# Patient Record
Sex: Female | Born: 1955 | Race: White | Hispanic: No | Smoking: Current every day smoker
Health system: Southern US, Community
[De-identification: ages and names within clinical notes are randomized; demographics above are authoritative.]

## PROBLEM LIST (undated history)

## (undated) DIAGNOSIS — M542 Cervicalgia: Secondary | ICD-10-CM

## (undated) DIAGNOSIS — F32A Depression, unspecified: Secondary | ICD-10-CM

## (undated) DIAGNOSIS — I10 Essential (primary) hypertension: Secondary | ICD-10-CM

## (undated) DIAGNOSIS — F17200 Nicotine dependence, unspecified, uncomplicated: Secondary | ICD-10-CM

## (undated) DIAGNOSIS — K219 Gastro-esophageal reflux disease without esophagitis: Secondary | ICD-10-CM

## (undated) DIAGNOSIS — L219 Seborrheic dermatitis, unspecified: Secondary | ICD-10-CM

## (undated) DIAGNOSIS — M5090 Cervical disc disorder, unspecified, unspecified cervical region: Secondary | ICD-10-CM

## (undated) DIAGNOSIS — M545 Other chronic pain: Secondary | ICD-10-CM

## (undated) DIAGNOSIS — R079 Chest pain, unspecified: Secondary | ICD-10-CM

## (undated) DIAGNOSIS — G8929 Other chronic pain: Secondary | ICD-10-CM

## (undated) DIAGNOSIS — M25519 Pain in unspecified shoulder: Secondary | ICD-10-CM

## (undated) DIAGNOSIS — M1990 Unspecified osteoarthritis, unspecified site: Secondary | ICD-10-CM

## (undated) DIAGNOSIS — F329 Major depressive disorder, single episode, unspecified: Secondary | ICD-10-CM

## (undated) DIAGNOSIS — M25549 Pain in joints of unspecified hand: Secondary | ICD-10-CM

## (undated) DIAGNOSIS — R7303 Prediabetes: Secondary | ICD-10-CM

## (undated) DIAGNOSIS — E785 Hyperlipidemia, unspecified: Secondary | ICD-10-CM

## (undated) HISTORY — DX: Depression, unspecified: F32.A

## (undated) HISTORY — DX: Chest pain, unspecified: R07.9

## (undated) HISTORY — DX: Gastro-esophageal reflux disease without esophagitis: K21.9

## (undated) HISTORY — DX: Prediabetes: R73.03

## (undated) HISTORY — DX: Pain in unspecified shoulder: M25.519

## (undated) HISTORY — DX: Cervical disc disorder, unspecified, unspecified cervical region: M50.90

## (undated) HISTORY — DX: Unspecified osteoarthritis, unspecified site: M19.90

## (undated) HISTORY — DX: Other chronic pain: G89.29

## (undated) HISTORY — DX: Seborrheic dermatitis, unspecified: L21.9

## (undated) HISTORY — DX: Pain in joints of unspecified hand: M25.549

## (undated) HISTORY — DX: Other chronic pain: M54.50

## (undated) HISTORY — DX: Hyperlipidemia, unspecified: E78.5

## (undated) HISTORY — DX: Major depressive disorder, single episode, unspecified: F32.9

## (undated) HISTORY — PX: TUBAL LIGATION: SHX77

## (undated) HISTORY — DX: Nicotine dependence, unspecified, uncomplicated: F17.200

## (undated) HISTORY — PX: CHOLECYSTECTOMY: SHX55

## (undated) HISTORY — DX: Cervicalgia: M54.2

## (undated) HISTORY — DX: Essential (primary) hypertension: I10

---

## 1998-11-08 ENCOUNTER — Emergency Department (HOSPITAL_COMMUNITY): Admission: EM | Admit: 1998-11-08 | Discharge: 1998-11-08 | Payer: Self-pay | Admitting: Emergency Medicine

## 2001-01-24 ENCOUNTER — Other Ambulatory Visit: Admission: RE | Admit: 2001-01-24 | Discharge: 2001-01-24 | Payer: Self-pay | Admitting: Obstetrics and Gynecology

## 2001-02-04 ENCOUNTER — Emergency Department (HOSPITAL_COMMUNITY): Admission: EM | Admit: 2001-02-04 | Discharge: 2001-02-04 | Payer: Self-pay | Admitting: *Deleted

## 2001-02-04 ENCOUNTER — Encounter: Payer: Self-pay | Admitting: *Deleted

## 2009-06-17 ENCOUNTER — Other Ambulatory Visit: Admission: RE | Admit: 2009-06-17 | Discharge: 2009-06-17 | Payer: Self-pay | Admitting: Orthopaedic Surgery

## 2012-09-19 ENCOUNTER — Other Ambulatory Visit (HOSPITAL_COMMUNITY): Payer: Self-pay | Admitting: Internal Medicine

## 2012-09-19 DIAGNOSIS — Z139 Encounter for screening, unspecified: Secondary | ICD-10-CM

## 2012-09-26 ENCOUNTER — Ambulatory Visit (HOSPITAL_COMMUNITY)
Admission: RE | Admit: 2012-09-26 | Discharge: 2012-09-26 | Disposition: A | Discharge status: Home / Self Care | Payer: BC Managed Care – PPO | Source: Ambulatory Visit | Attending: Internal Medicine | Admitting: Internal Medicine

## 2012-09-26 DIAGNOSIS — Z139 Encounter for screening, unspecified: Secondary | ICD-10-CM

## 2012-09-26 DIAGNOSIS — Z1231 Encounter for screening mammogram for malignant neoplasm of breast: Secondary | ICD-10-CM | POA: Insufficient documentation

## 2012-09-29 ENCOUNTER — Encounter: Payer: Self-pay | Admitting: *Deleted

## 2012-10-17 ENCOUNTER — Other Ambulatory Visit (HOSPITAL_COMMUNITY)
Admission: RE | Admit: 2012-10-17 | Discharge: 2012-10-17 | Disposition: A | Discharge status: Home / Self Care | Payer: BC Managed Care – PPO | Source: Ambulatory Visit | Attending: Obstetrics & Gynecology | Admitting: Obstetrics & Gynecology

## 2012-10-17 ENCOUNTER — Ambulatory Visit (INDEPENDENT_AMBULATORY_CARE_PROVIDER_SITE_OTHER): Payer: BC Managed Care – HMO | Admitting: Obstetrics & Gynecology

## 2012-10-17 ENCOUNTER — Encounter: Payer: Self-pay | Admitting: Obstetrics & Gynecology

## 2012-10-17 ENCOUNTER — Telehealth: Payer: Self-pay

## 2012-10-17 VITALS — BP 150/84 | Ht 63.0 in | Wt 178.8 lb

## 2012-10-17 DIAGNOSIS — Z1151 Encounter for screening for human papillomavirus (HPV): Secondary | ICD-10-CM | POA: Insufficient documentation

## 2012-10-17 DIAGNOSIS — Z1212 Encounter for screening for malignant neoplasm of rectum: Secondary | ICD-10-CM

## 2012-10-17 DIAGNOSIS — Z01419 Encounter for gynecological examination (general) (routine) without abnormal findings: Secondary | ICD-10-CM

## 2012-10-17 LAB — HEMOCCULT GUIAC POC 1CARD (OFFICE): Fecal Occult Blood, POC: NEGATIVE

## 2012-10-17 MED ORDER — OSPEMIFENE 60 MG PO TABS: 1.0000 | ORAL_TABLET | Freq: Every day | ORAL | Status: DC

## 2012-10-17 NOTE — Telephone Encounter (Signed)
Pt was referred by Dr. Dwana Melena for screening colonoscopy. Letter was mailed on 09/19/2012. I tried to call just now and number is not a working number. Will mail another letter and send letter to PCP.

## 2012-10-17 NOTE — Progress Notes (Signed)
Patient ID: Brooke Webb, female   DOB: [birth year], 57 y.o.   MRN: [id number] Subjective:     Brooke Webb is a 57 y.o. female here for a routine exam.  No LMP recorded. Patient is postmenopausal. No obstetric history on file. Current complaints: none.  Personal health questionnaire reviewed: no.   Gynecologic History No LMP recorded. Patient is postmenopausal. Contraception: post menopausal status Last Pap: 20 years ago. Results were: normal Last mammogram: 2014. Results were: normal  Obstetric History OB History  No data available     The following portions of the patient's history were reviewed and updated as appropriate: allergies, current medications, past family history, past medical history, past social history, past surgical history and problem list.  Review of Systems  Review of Systems  Constitutional: Negative for fever, chills, weight loss, malaise/fatigue and diaphoresis.  HENT: Negative for hearing loss, ear pain, nosebleeds, congestion, sore throat, neck pain, tinnitus and ear discharge.   Eyes: Negative for blurred vision, double vision, photophobia, pain, discharge and redness.  Respiratory: Negative for cough, hemoptysis, sputum production, shortness of breath, wheezing and stridor.   Cardiovascular: Negative for chest pain, palpitations, orthopnea, claudication, leg swelling and PND.  Gastrointestinal: negative for abdominal pain. Negative for heartburn, nausea, vomiting, diarrhea, constipation, blood in stool and melena.  Genitourinary: Negative for dysuria, urgency, frequency, hematuria and flank pain.  Musculoskeletal: Negative for myalgias, back pain, joint pain and falls.  Skin: Negative for itching and rash.  Neurological: Negative for dizziness, tingling, tremors, sensory change, speech change, focal weakness, seizures, loss of consciousness, weakness and headaches.  Endo/Heme/Allergies: Negative for environmental allergies and polydipsia. Does not  bruise/bleed easily.  Psychiatric/Behavioral: Negative for depression, suicidal ideas, hallucinations, memory loss and substance abuse. The patient is not nervous/anxious and does not have insomnia.        Objective:    Physical Exam  Vitals reviewed. Constitutional: She is oriented to person, place, and time. She appears well-developed and well-nourished.  HENT:  Head: Normocephalic and atraumatic.        Right Ear: External ear normal.  Left Ear: External ear normal.  Nose: Nose normal.  Mouth/Throat: Oropharynx is clear and moist.  Eyes: Conjunctivae and EOM are normal. Pupils are equal, round, and reactive to light. Right eye exhibits no discharge. Left eye exhibits no discharge. No scleral icterus.  Neck: Normal range of motion. Neck supple. No tracheal deviation present. No thyromegaly present.  Cardiovascular: Normal rate, regular rhythm, normal heart sounds and intact distal pulses.  Exam reveals no gallop and no friction rub.   No murmur heard. Respiratory: Effort normal and breath sounds normal. No respiratory distress. She has no wheezes. She has no rales. She exhibits no tenderness.  GI: Soft. Bowel sounds are normal. She exhibits no distension and no mass. There is no tenderness. There is no rebound and no guarding.  Genitourinary:  Breasts no masses or skin changes      Vulva is normal without lesions Vagina is pink moist without discharge Cervix normal in appearance and pap is done Uterus is normal size shape and contour Adnexa is negative with normal sized ovaries  Rectal heme negative no masses  Musculoskeletal: Normal range of motion. She exhibits no edema and no tenderness.  Neurological: She is alert and oriented to person, place, and time. She has normal reflexes. She displays normal reflexes. No cranial nerve deficit. She exhibits normal muscle tone. Coordination normal.  Skin: Skin is warm and dry. No rash noted. No erythema. No  pallor.  Psychiatric: She has a  normal mood and affect. Her behavior is normal. Judgment and thought content normal.       Assessment:    Healthy female exam.    Plan:    Follow up in: 1 year. osphena 60 mg daily

## 2013-09-21 IMAGING — MG MM DIGITAL SCREENING
4 series · 4 of 4 positions shown · non-contrast
Comparison: Previous exam(s).

CLINICAL DATA: Screening.

DIGITAL SCREENING BILATERAL MAMMOGRAM WITH CAD

[L CC]
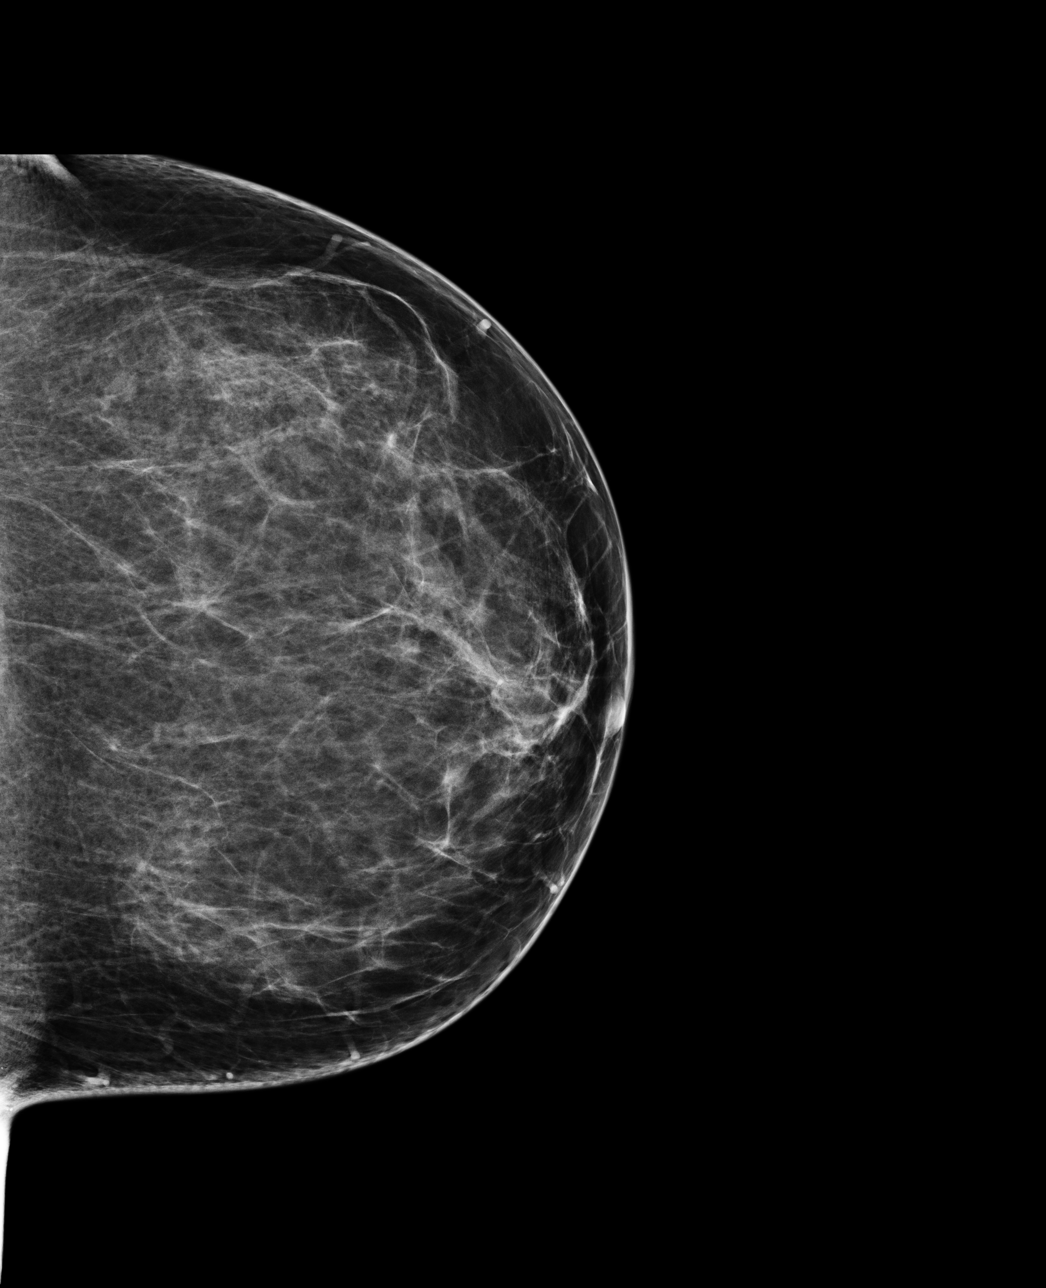

[L MLO]
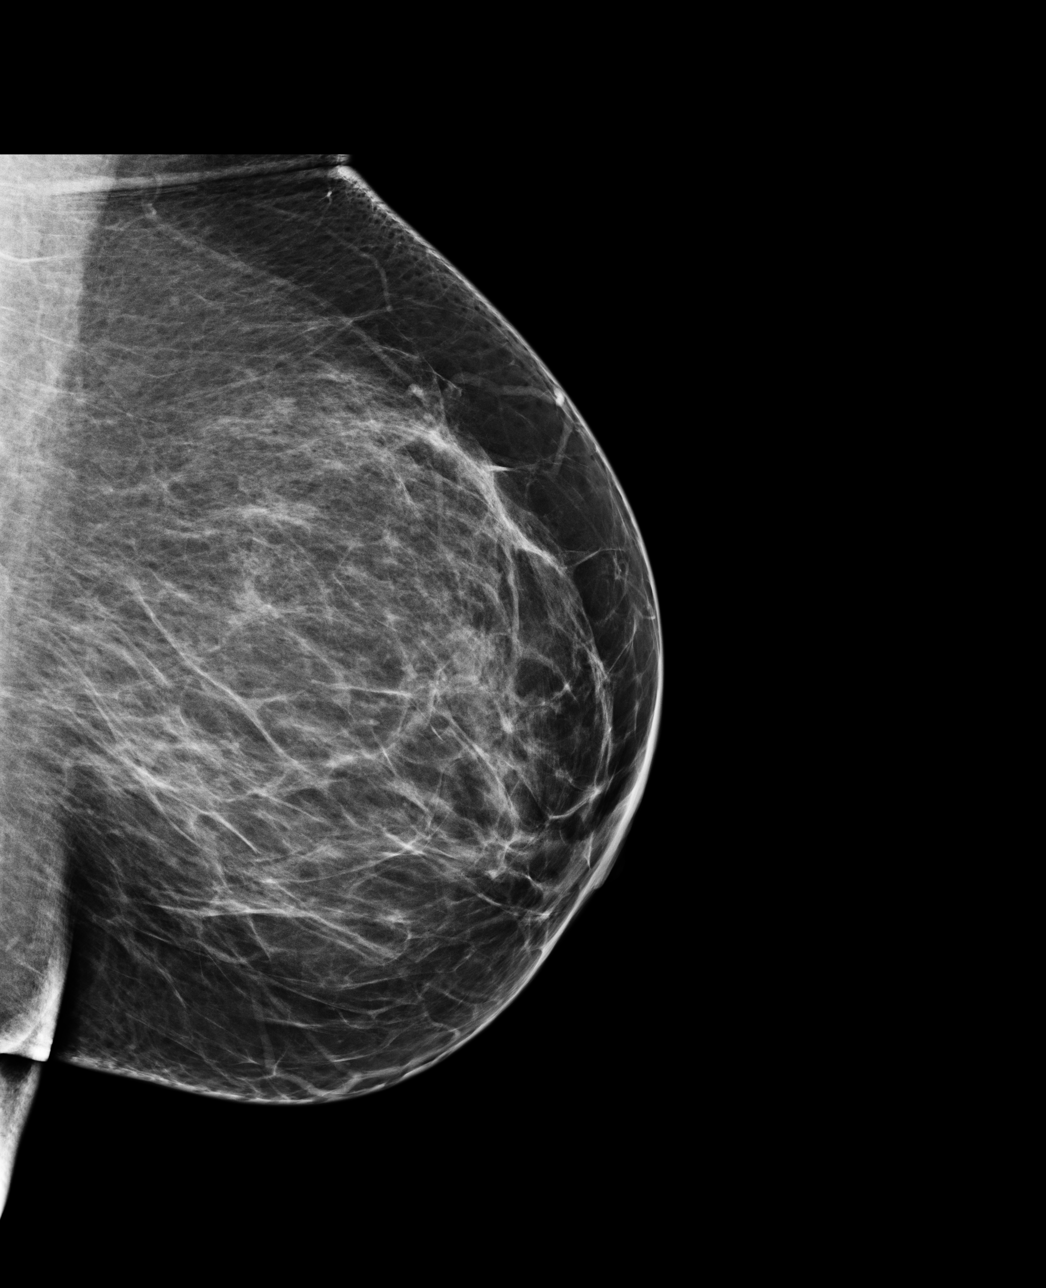

[R CC]
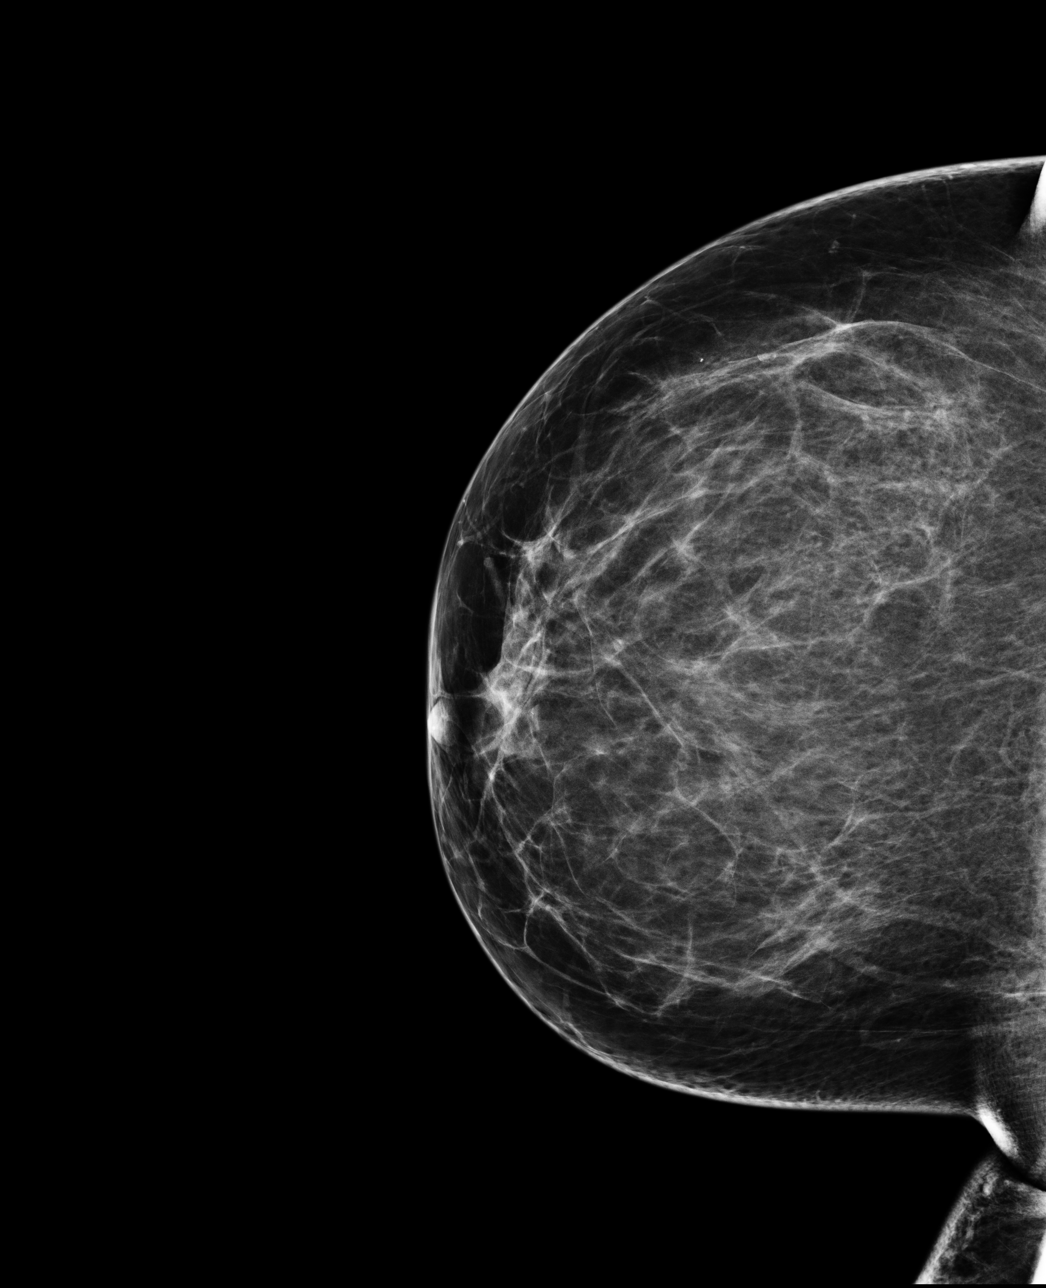

[R MLO]
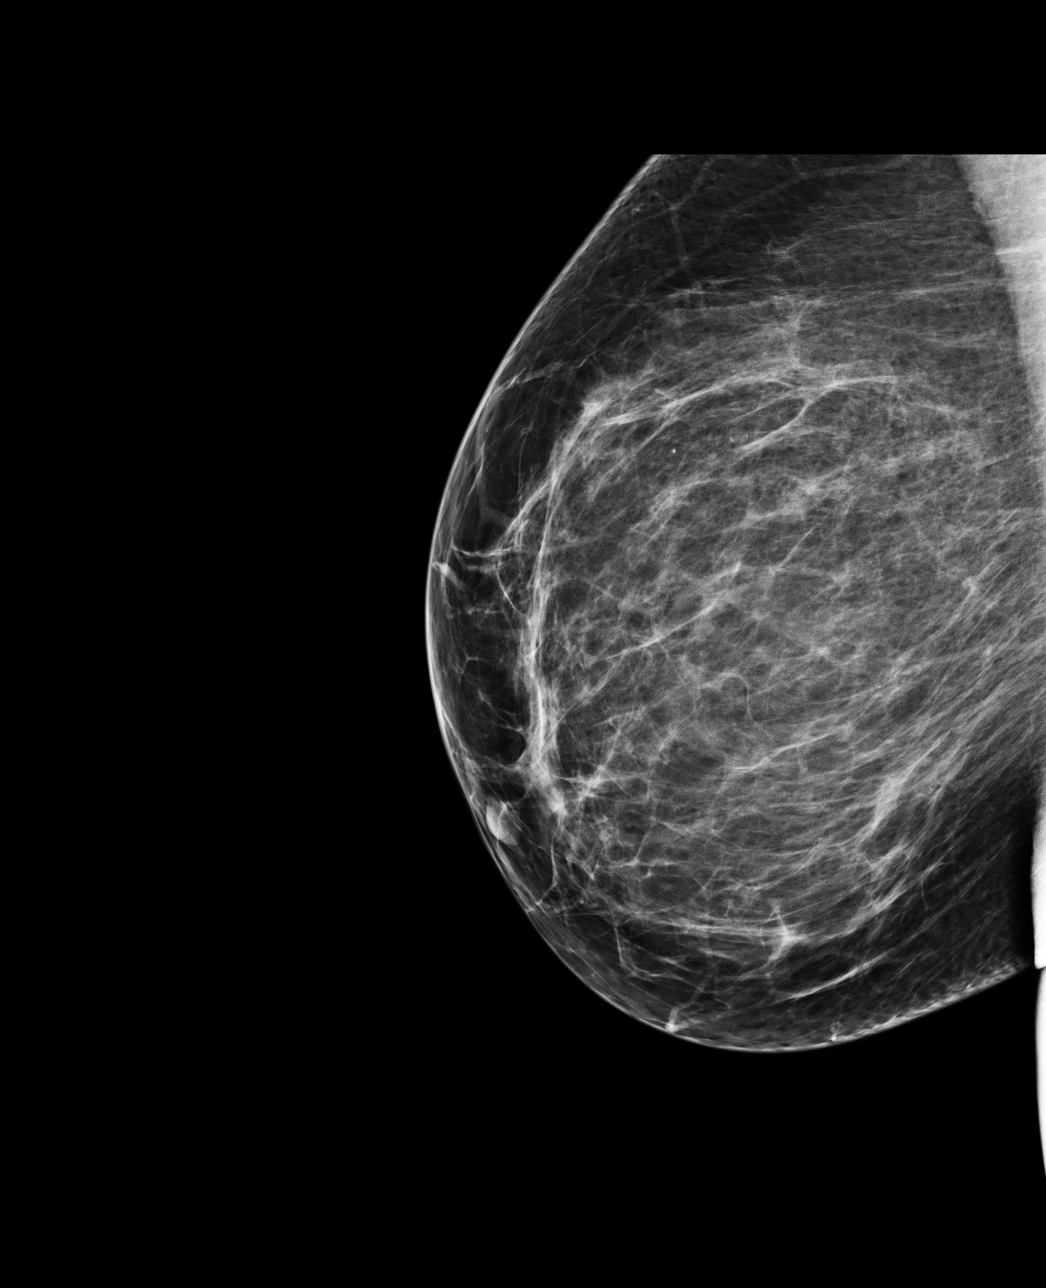

[4 of 4 positions shown; findings below may reference images not displayed]

FINDINGS: ACR Breast Density Category b:  There are scattered areas of
fibroglandular density.

There are no findings suspicious for malignancy.

Images were processed with CAD.
IMPRESSION: No mammographic evidence of malignancy.

A result letter of this screening mammogram will be mailed directly
to the patient.

RECOMMENDATION:
Screening mammogram in one year. (Code:RE-T-HFS)

BI-RADS CATEGORY 1:  Negative.

## 2015-07-28 ENCOUNTER — Telehealth: Payer: Self-pay | Admitting: Orthopaedic Surgery

## 2015-07-29 MED ORDER — NAPROXEN 500 MG PO TABS: 500.0000 mg | ORAL_TABLET | Freq: Two times a day (BID) | ORAL | Status: DC

## 2015-07-29 NOTE — Telephone Encounter (Signed)
Rx Done . 

## 2015-09-04 ENCOUNTER — Telehealth: Payer: Self-pay | Admitting: Orthopaedic Surgery

## 2015-10-24 ENCOUNTER — Telehealth: Payer: Self-pay | Admitting: Orthopaedic Surgery

## 2016-04-22 ENCOUNTER — Ambulatory Visit (INDEPENDENT_AMBULATORY_CARE_PROVIDER_SITE_OTHER): Payer: BLUE CROSS/BLUE SHIELD

## 2016-04-22 ENCOUNTER — Encounter: Payer: Self-pay | Admitting: Orthopaedic Surgery

## 2016-04-22 ENCOUNTER — Ambulatory Visit (INDEPENDENT_AMBULATORY_CARE_PROVIDER_SITE_OTHER): Payer: BLUE CROSS/BLUE SHIELD | Admitting: Orthopaedic Surgery

## 2016-04-22 VITALS — BP 163/88 | HR 70 | Ht 62.0 in | Wt 152.0 lb

## 2016-04-22 DIAGNOSIS — M25511 Pain in right shoulder: Secondary | ICD-10-CM

## 2016-04-22 DIAGNOSIS — G8929 Other chronic pain: Secondary | ICD-10-CM

## 2016-04-22 DIAGNOSIS — M542 Cervicalgia: Secondary | ICD-10-CM

## 2016-04-22 DIAGNOSIS — F17210 Nicotine dependence, cigarettes, uncomplicated: Secondary | ICD-10-CM

## 2016-04-22 MED ORDER — PREDNISONE 5 MG (21) PO TBPK
ORAL_TABLET | ORAL | 0 refills | Status: DC
Start: 2016-04-22 — End: 2021-07-14

## 2016-04-22 NOTE — Progress Notes (Signed)
Patient WU:JWJXBJ:Brooke Webb, female DOB:Jul 10, 1955, 61 y.o. [id number]  Chief Complaint  Patient presents with  . Shoulder Pain    RIGHT SHOULDER PAIN    HPI  Brooke Webb is a 61 y.o. female who is seen by me for the first time since December 2015.  She has neck pain with paresthesias down the right arm to the right hand, more of the index finger.  She has no weakness or trauma.  She has also recurrent pain of the right shoulder at times.  She is better today but has pain with overhead use.  She has no redness or swelling.  She also smokes and is not willing to stop.  HPI  Body mass index is 27.8 kg/m.  ROS  Review of Systems  HENT: Negative for congestion.   Respiratory: Negative for cough and shortness of breath.   Cardiovascular: Negative for chest pain and leg swelling.  Endocrine: Negative for cold intolerance.  Musculoskeletal: Positive for arthralgias and neck pain.  Allergic/Immunologic: Negative for environmental allergies.    Past Medical History:  Diagnosis Date  . Arthralgia of hand   . Arthralgia of shoulder   . Depression   . Hypertension     Past Surgical History:  Procedure Laterality Date  . CHOLECYSTECTOMY    . TUBAL LIGATION      Family History  Problem Relation Age of Onset  . Heart disease Mother   . Diabetes Mother     Social History Social History  Substance Use Topics  . Smoking status: Current Every Day Smoker    Packs/day: 0.50    Types: Cigarettes  . Smokeless tobacco: Never Used  . Alcohol use No    No Known Allergies  Current Outpatient Prescriptions  Medication Sig Dispense Refill  . aspirin 325 MG tablet Take 325 mg by mouth daily.    Marland Kitchen. ibuprofen (ADVIL,MOTRIN) 600 MG tablet Take 600 mg by mouth every 6 (six) hours as needed for pain.    Marland Kitchen. losartan-hydrochlorothiazide (HYZAAR) 50-12.5 MG per tablet Take 1 tablet by mouth daily.    . naproxen (NAPROSYN) 500 MG tablet Take 1 tablet (500 mg total) by mouth 2 (two)  times daily with a meal. 60 tablet 5  . naproxen (NAPROSYN) 500 MG tablet TAKE ONE TABLET BY MOUTH TWICE DAILY AFTER A MEAL 60 tablet 5  . OMEPRAZOLE PO Take 10 mg by mouth daily.     . Ospemifene (OSPHENA) 60 MG TABS Take 1 tablet by mouth daily. 30 tablet 11  . predniSONE (STERAPRED UNI-PAK 21 TAB) 5 MG (21) TBPK tablet Take 6 pills first day; 5 pills second day; 4 pills third day; 3 pills fourth day; 2 pills next day and 1 pill last day. 21 tablet 0  . venlafaxine (EFFEXOR) 75 MG tablet Take 75 mg by mouth daily.     No current facility-administered medications for this visit.      Physical Exam  Blood pressure (!) 163/88, pulse 70, height 5\' 2"  (1.575 m), weight 152 lb (68.9 kg).  Constitutional: overall normal hygiene, normal nutrition, well developed, normal grooming, normal body habitus. Assistive device:none  Musculoskeletal: gait and station Limp none, muscle tone and strength are normal, no tremors or atrophy is present.  .  Neurological: coordination overall normal.  Deep tendon reflex/nerve stretch intact.  Sensation normal.  Cranial nerves II-XII intact.   Skin:   Normal overall no scars, lesions, ulcers or rashes. No psoriasis.  Psychiatric: Alert and oriented x 3.  Recent memory  intact, remote memory unclear.  Normal mood and affect. Well groomed.  Good eye contact.  Cardiovascular: overall no swelling, no varicosities, no edema bilaterally, normal temperatures of the legs and arms, no clubbing, cyanosis and good capillary refill.  Lymphatic: palpation is normal.  Her neck has full motion but is more tender to the right with motion.  NV is intact.  She has no spasm.  Examination of right Upper Extremity is done.  Inspection:   Overall:  Elbow non-tender without crepitus or defects, forearm non-tender without crepitus or defects, wrist non-tender without crepitus or defects, hand non-tender.    Shoulder: with glenohumeral joint tenderness, without effusion.   Upper  arm: without swelling and tenderness   Range of motion:   Overall:  Full range of motion of the elbow, full range of motion of wrist and full range of motion in fingers.   Shoulder:  right  180 degrees forward flexion; 180 degrees abduction; 40 degrees internal rotation, 40 degrees external rotation, 20 degrees extension, 40 degrees adduction.   Stability:   Overall:  Shoulder, elbow and wrist stable   Strength and Tone:   Overall full shoulder muscles strength, full upper arm strength and normal upper arm bulk and tone.  X-rays were done of the right shoulder and her neck, reported separately.  The patient has been educated about the nature of the problem(s) and counseled on treatment options.  The patient appeared to understand what I have discussed and is in agreement with it.  Encounter Diagnoses  Name Primary?  . Neck pain Yes  . Chronic right shoulder pain   . Cigarette nicotine dependence without complication     PLAN Call if any problems.  Precautions discussed.  Continue current medications except stop the Naprosyn while she is on the prednisone dose pack I will give.  She may need MRI of the neck.  Return to clinic 2 weeks   Electronically Signed Darreld Mclean, MD 3/15/201811:39 AM

## 2016-05-06 ENCOUNTER — Ambulatory Visit (INDEPENDENT_AMBULATORY_CARE_PROVIDER_SITE_OTHER): Payer: BLUE CROSS/BLUE SHIELD | Admitting: Orthopaedic Surgery

## 2016-05-06 ENCOUNTER — Encounter: Payer: Self-pay | Admitting: Orthopaedic Surgery

## 2016-05-06 VITALS — BP 135/93 | HR 77 | Temp 97.9°F | Ht 62.0 in | Wt 152.0 lb

## 2016-05-06 DIAGNOSIS — M542 Cervicalgia: Secondary | ICD-10-CM

## 2016-05-06 DIAGNOSIS — M25511 Pain in right shoulder: Secondary | ICD-10-CM | POA: Diagnosis not present

## 2016-05-06 DIAGNOSIS — G8929 Other chronic pain: Secondary | ICD-10-CM | POA: Diagnosis not present

## 2016-05-06 NOTE — Progress Notes (Signed)
Patient ZO:XWRUEA:Brooke Webb, female DOB:16-Jan-1956, 61 y.o. [id number]  Chief Complaint  Patient presents with  . Follow-up    neck pain    HPI  Brooke Webb is a 61 y.o. female who has had right shoulder pain and neck pain.  She is much improved after the prednisone and medicine.  She has no paresthesias. She is moving well.  She has no redness and she is doing her exercises. HPI  Body mass index is 27.8 kg/m.  ROS  Review of Systems  HENT: Negative for congestion.   Respiratory: Negative for cough and shortness of breath.   Cardiovascular: Negative for chest pain and leg swelling.  Endocrine: Negative for cold intolerance.  Musculoskeletal: Positive for arthralgias and neck pain.  Allergic/Immunologic: Negative for environmental allergies.    Past Medical History:  Diagnosis Date  . Arthralgia of hand   . Arthralgia of shoulder   . Depression   . Hypertension     Past Surgical History:  Procedure Laterality Date  . CHOLECYSTECTOMY    . TUBAL LIGATION      Family History  Problem Relation Age of Onset  . Heart disease Mother   . Diabetes Mother     Social History Social History  Substance Use Topics  . Smoking status: Current Every Day Smoker    Packs/day: 0.50    Types: Cigarettes  . Smokeless tobacco: Never Used  . Alcohol use No    No Known Allergies  Current Outpatient Prescriptions  Medication Sig Dispense Refill  . aspirin 325 MG tablet Take 325 mg by mouth daily.    Marland Kitchen. ibuprofen (ADVIL,MOTRIN) 600 MG tablet Take 600 mg by mouth every 6 (six) hours as needed for pain.    Marland Kitchen. losartan-hydrochlorothiazide (HYZAAR) 50-12.5 MG per tablet Take 1 tablet by mouth daily.    . naproxen (NAPROSYN) 500 MG tablet Take 1 tablet (500 mg total) by mouth 2 (two) times daily with a meal. 60 tablet 5  . naproxen (NAPROSYN) 500 MG tablet TAKE ONE TABLET BY MOUTH TWICE DAILY AFTER A MEAL 60 tablet 5  . OMEPRAZOLE PO Take 10 mg by mouth daily.     . Ospemifene  (OSPHENA) 60 MG TABS Take 1 tablet by mouth daily. 30 tablet 11  . predniSONE (STERAPRED UNI-PAK 21 TAB) 5 MG (21) TBPK tablet Take 6 pills first day; 5 pills second day; 4 pills third day; 3 pills fourth day; 2 pills next day and 1 pill last day. 21 tablet 0  . venlafaxine (EFFEXOR) 75 MG tablet Take 75 mg by mouth daily.     No current facility-administered medications for this visit.      Physical Exam  Blood pressure (!) 135/93, pulse 77, temperature 97.9 F (36.6 C), height 5\' 2"  (1.575 m), weight 152 lb (68.9 kg).  Constitutional: overall normal hygiene, normal nutrition, well developed, normal grooming, normal body habitus. Assistive device:none  Musculoskeletal: gait and station Limp none, muscle tone and strength are normal, no tremors or atrophy is present.  .  Neurological: coordination overall normal.  Deep tendon reflex/nerve stretch intact.  Sensation normal.  Cranial nerves II-XII intact.   Skin:   Normal overall no scars, lesions, ulcers or rashes. No psoriasis.  Psychiatric: Alert and oriented x 3.  Recent memory intact, remote memory unclear.  Normal mood and affect. Well groomed.  Good eye contact.  Cardiovascular: overall no swelling, no varicosities, no edema bilaterally, normal temperatures of the legs and arms, no clubbing, cyanosis and good capillary  refill.  Lymphatic: palpation is normal.  Her neck has full ROM as does the right shoulder. NV is intact.  Grips are normal.  She has no spasm or swelling.  The patient has been educated about the nature of the problem(s) and counseled on treatment options.  The patient appeared to understand what I have discussed and is in agreement with it.  Encounter Diagnoses  Name Primary?  . Neck pain Yes  . Chronic right shoulder pain     PLAN Call if any problems.  Precautions discussed.  Continue current medications.   Return to clinic 1 month   Electronically Signed Darreld Mclean, MD 3/29/201810:54 AM

## 2016-05-06 NOTE — Patient Instructions (Signed)
Steps to Quit Smoking Smoking tobacco can be bad for your health. It can also affect almost every organ in your body. Smoking puts you and people around you at risk for many serious long-lasting (chronic) diseases. Quitting smoking is hard, but it is one of the best things that you can do for your health. It is never too late to quit. What are the benefits of quitting smoking? When you quit smoking, you lower your risk for getting serious diseases and conditions. They can include:  Lung cancer or lung disease.  Heart disease.  Stroke.  Heart attack.  Not being able to have children (infertility).  Weak bones (osteoporosis) and broken bones (fractures). If you have coughing, wheezing, and shortness of breath, those symptoms may get better when you quit. You may also get sick less often. If you are pregnant, quitting smoking can help to lower your chances of having a baby of low birth weight. What can I do to help me quit smoking? Talk with your doctor about what can help you quit smoking. Some things you can do (strategies) include:  Quitting smoking totally, instead of slowly cutting back how much you smoke over a period of time.  Going to in-person counseling. You are more likely to quit if you go to many counseling sessions.  Using resources and support systems, such as:  Online chats with a counselor.  Phone quitlines.  Printed self-help materials.  Support groups or group counseling.  Text messaging programs.  Mobile phone apps or applications.  Taking medicines. Some of these medicines may have nicotine in them. If you are pregnant or breastfeeding, do not take any medicines to quit smoking unless your doctor says it is okay. Talk with your doctor about counseling or other things that can help you. Talk with your doctor about using more than one strategy at the same time, such as taking medicines while you are also going to in-person counseling. This can help make quitting  easier. What things can I do to make it easier to quit? Quitting smoking might feel very hard at first, but there is a lot that you can do to make it easier. Take these steps:  Talk to your family and friends. Ask them to support and encourage you.  Call phone quitlines, reach out to support groups, or work with a counselor.  Ask people who smoke to not smoke around you.  Avoid places that make you want (trigger) to smoke, such as:  Bars.  Parties.  Smoke-break areas at work.  Spend time with people who do not smoke.  Lower the stress in your life. Stress can make you want to smoke. Try these things to help your stress:  Getting regular exercise.  Deep-breathing exercises.  Yoga.  Meditating.  Doing a body scan. To do this, close your eyes, focus on one area of your body at a time from head to toe, and notice which parts of your body are tense. Try to relax the muscles in those areas.  Download or buy apps on your mobile phone or tablet that can help you stick to your quit plan. There are many free apps, such as QuitGuide from the CDC (Centers for Disease Control and Prevention). You can find more support from smokefree.gov and other websites. This information is not intended to replace advice given to you by your health care provider. Make sure you discuss any questions you have with your health care provider. Document Released: 11/21/2008 Document Revised: 09/23/2015 Document   Reviewed: 06/11/2014 Elsevier Interactive Patient Education  2017 [employer]  

## 2016-06-03 ENCOUNTER — Ambulatory Visit: Payer: BLUE CROSS/BLUE SHIELD | Admitting: Orthopaedic Surgery

## 2016-07-28 ENCOUNTER — Telehealth: Payer: Self-pay | Admitting: *Deleted

## 2016-07-28 NOTE — Telephone Encounter (Signed)
Patient called yesterday and cancelled her appts for tody due to the weather. Patient requested morning appt. Called and left message informing  the patient that Dr. Andrey Farmerossi only sees new patients in the morning. Offer July 2nd or July 16th.

## 2016-07-28 NOTE — Telephone Encounter (Signed)
Patient called back and made appt for July 18th.

## 2016-07-31 ENCOUNTER — Telehealth: Payer: Self-pay | Admitting: Orthopaedic Surgery

## 2016-08-25 ENCOUNTER — Other Ambulatory Visit: Payer: BLUE CROSS/BLUE SHIELD

## 2016-08-25 ENCOUNTER — Ambulatory Visit: Payer: BLUE CROSS/BLUE SHIELD | Admitting: Gynecologic Oncology

## 2016-08-27 ENCOUNTER — Other Ambulatory Visit (HOSPITAL_COMMUNITY): Payer: Self-pay | Admitting: Internal Medicine

## 2016-08-27 DIAGNOSIS — Z1231 Encounter for screening mammogram for malignant neoplasm of breast: Secondary | ICD-10-CM

## 2016-09-03 ENCOUNTER — Inpatient Hospital Stay (HOSPITAL_COMMUNITY): Admission: RE | Admit: 2016-09-03 | Payer: BLUE CROSS/BLUE SHIELD | Source: Ambulatory Visit

## 2016-09-15 ENCOUNTER — Ambulatory Visit (HOSPITAL_COMMUNITY)
Admission: RE | Admit: 2016-09-15 | Discharge: 2016-09-15 | Disposition: A | Discharge status: Home / Self Care | Payer: BLUE CROSS/BLUE SHIELD | Source: Ambulatory Visit | Attending: Internal Medicine | Admitting: Internal Medicine

## 2016-09-15 ENCOUNTER — Ambulatory Visit (HOSPITAL_COMMUNITY): Payer: BLUE CROSS/BLUE SHIELD

## 2016-09-15 DIAGNOSIS — Z1231 Encounter for screening mammogram for malignant neoplasm of breast: Secondary | ICD-10-CM | POA: Insufficient documentation

## 2017-06-01 ENCOUNTER — Other Ambulatory Visit: Payer: Self-pay | Admitting: Orthopaedic Surgery

## 2017-06-02 NOTE — Telephone Encounter (Signed)
Refill request received via fax for Naproxen last visit with Dr Hilda LiasKeeling was 07/2016.

## 2017-12-25 ENCOUNTER — Other Ambulatory Visit: Payer: Self-pay | Admitting: Orthopedic Surgery

## 2018-10-01 ENCOUNTER — Other Ambulatory Visit: Payer: Self-pay | Admitting: Orthopedic Surgery

## 2018-11-22 ENCOUNTER — Other Ambulatory Visit: Payer: Self-pay | Admitting: Orthopedic Surgery

## 2019-01-05 ENCOUNTER — Other Ambulatory Visit: Payer: Self-pay | Admitting: Orthopedic Surgery

## 2019-01-08 ENCOUNTER — Other Ambulatory Visit: Payer: Self-pay | Admitting: Orthopedic Surgery

## 2019-01-08 ENCOUNTER — Telehealth: Payer: Self-pay

## 2019-01-08 MED ORDER — NAPROXEN 500 MG PO TABS: 500.0000 mg | ORAL_TABLET | Freq: Two times a day (BID) | ORAL | 5 refills | Status: DC

## 2019-01-08 NOTE — Telephone Encounter (Signed)
Naproxen  500 mg  Qty  60 Tablets  Take 1 tablet by mouth twice daily with meals.  PATIENT USES [facility]

## 2019-01-08 NOTE — Telephone Encounter (Signed)
This was done.

## 2019-10-16 ENCOUNTER — Other Ambulatory Visit (HOSPITAL_COMMUNITY): Payer: Self-pay | Admitting: Internal Medicine

## 2019-10-16 DIAGNOSIS — Z1231 Encounter for screening mammogram for malignant neoplasm of breast: Secondary | ICD-10-CM

## 2019-10-22 ENCOUNTER — Other Ambulatory Visit: Payer: Self-pay

## 2019-10-22 ENCOUNTER — Ambulatory Visit (HOSPITAL_COMMUNITY)
Admission: RE | Admit: 2019-10-22 | Discharge: 2019-10-22 | Disposition: A | Discharge status: Home / Self Care | Payer: 59 | Source: Ambulatory Visit | Attending: Internal Medicine | Admitting: Internal Medicine

## 2019-10-22 DIAGNOSIS — Z1231 Encounter for screening mammogram for malignant neoplasm of breast: Secondary | ICD-10-CM | POA: Diagnosis not present

## 2019-10-24 ENCOUNTER — Encounter: Payer: Self-pay | Admitting: Gastroenterology

## 2019-10-24 ENCOUNTER — Encounter: Payer: Self-pay | Admitting: *Deleted

## 2019-12-24 ENCOUNTER — Ambulatory Visit: Payer: 59

## 2019-12-24 ENCOUNTER — Encounter: Payer: Self-pay | Admitting: Gastroenterology

## 2019-12-24 ENCOUNTER — Ambulatory Visit: Payer: 59 | Admitting: Gastroenterology

## 2020-10-30 ENCOUNTER — Encounter: Payer: Self-pay | Admitting: Orthopaedic Surgery

## 2020-10-30 ENCOUNTER — Other Ambulatory Visit: Payer: Self-pay

## 2020-10-30 ENCOUNTER — Ambulatory Visit: Payer: 59 | Admitting: Orthopaedic Surgery

## 2020-10-30 VITALS — BP 147/76 | HR 79 | Ht 63.0 in | Wt 165.2 lb

## 2020-10-30 DIAGNOSIS — M65342 Trigger finger, left ring finger: Secondary | ICD-10-CM | POA: Diagnosis not present

## 2020-10-30 NOTE — Progress Notes (Signed)
She has developed trigger finger of the nondominant left ring finger.  It has been present about six weeks to two months.  It is locking at night.  She has no trauma.  She had trigger finger right thumb in the past.  She has no redness.  She has triggering of the left ring finger at the A1 pulley.  NV intact.  Encounter Diagnosis  Name Primary?   Trigger finger, left ring finger Yes   PROCEDURE  Trigger Finger Injection  The left Ring finger  has been locking at the A1 pulley.  The patient has been told about injection of the digit.  Surgical correction and excision of the A1 pulley will resolve the problem.  Ani injection in the digit should help but the results may be short lived.  The patient asked appropriate questions and understands the procedure.  The patient has elected for an injection at this time.  Verbal consent was obtained.  A timeout was taken to confirm the proper hand and digit.  Medication  1 mL of Celestone 6 mg  2 mL of 1% lidocaine plain  Ethyl chloride for anesthesia  Alcohol was used to prepare the skin along with ethyl chloride and then the injection was made at the A1 pulley there were no complications.  It was tolerated well.  A Band-aid dressing was applied.  Call if any problem or difficulty.  I told her the definitive treatment is surgical release.  I will see her as needed.  Call if any problem.  Precautions discussed.  Electronically Signed Darreld Mclean, MD 9/22/202210:29 AM

## 2021-05-13 ENCOUNTER — Encounter (INDEPENDENT_AMBULATORY_CARE_PROVIDER_SITE_OTHER): Payer: Self-pay | Admitting: *Deleted

## 2021-06-26 ENCOUNTER — Encounter: Payer: Self-pay | Admitting: *Deleted

## 2021-06-29 ENCOUNTER — Ambulatory Visit: Payer: PRIVATE HEALTH INSURANCE | Admitting: Cardiology

## 2021-06-29 NOTE — Progress Notes (Deleted)
Clinical Summary Ms. Dimaria is a 66 y.o.female seen today as a new consult, referred by Dr Margo Aye for the following medical problems.   1.Chest pain Past Medical History:  Diagnosis Date   Arthralgia of hand    Arthralgia of shoulder    Cervical disc disorder    Chest pain    Chronic low back pain    Depression    GERD (gastroesophageal reflux disease)    Hyperlipidemia    Hypertension    Neck pain    Nicotine dependence    Osteoarthritis    Prediabetes    Seborrheic dermatitis      Allergies  Allergen Reactions   Codeine Nausea And Vomiting     Current Outpatient Medications  Medication Sig Dispense Refill   aspirin 325 MG tablet Take 325 mg by mouth daily.     aspirin EC 81 MG tablet Take 81 mg by mouth daily. Swallow whole.     DULoxetine (CYMBALTA) 60 MG capsule Take 60 mg by mouth daily.     ibuprofen (ADVIL,MOTRIN) 600 MG tablet Take 600 mg by mouth every 6 (six) hours as needed for pain. (Patient not taking: Reported on 10/30/2020)     losartan (COZAAR) 100 MG tablet Take 100 mg by mouth daily.     losartan (COZAAR) 50 MG tablet Take 50 mg by mouth daily.     losartan-hydrochlorothiazide (HYZAAR) 50-12.5 MG per tablet Take 1 tablet by mouth daily.     naproxen (NAPROSYN) 500 MG tablet Take 1 tablet (500 mg total) by mouth 2 (two) times daily with a meal. (Patient not taking: Reported on 10/30/2020) 60 tablet 5   omeprazole (PRILOSEC) 20 MG capsule Take 20 mg by mouth daily.     OMEPRAZOLE PO Take 10 mg by mouth daily.      Ospemifene (OSPHENA) 60 MG TABS Take 1 tablet by mouth daily. (Patient not taking: Reported on 10/30/2020) 30 tablet 11   predniSONE (STERAPRED UNI-PAK 21 TAB) 5 MG (21) TBPK tablet Take 6 pills first day; 5 pills second day; 4 pills third day; 3 pills fourth day; 2 pills next day and 1 pill last day. (Patient not taking: Reported on 10/30/2020) 21 tablet 0   venlafaxine (EFFEXOR) 75 MG tablet Take 75 mg by mouth daily. (Patient not taking:  Reported on 10/30/2020)     No current facility-administered medications for this visit.     Past Surgical History:  Procedure Laterality Date   CHOLECYSTECTOMY     TUBAL LIGATION       Allergies  Allergen Reactions   Codeine Nausea And Vomiting      Family History  Problem Relation Age of Onset   Heart disease Mother    Diabetes Mother      Social History Ms. Woldt reports that she has been smoking cigarettes. She has been smoking an average of .5 packs per day. She has never used smokeless tobacco. Ms. Rader reports no history of alcohol use.   Review of Systems CONSTITUTIONAL: No weight loss, fever, chills, weakness or fatigue.  HEENT: Eyes: No visual loss, blurred vision, double vision or yellow sclerae.No hearing loss, sneezing, congestion, runny nose or sore throat.  SKIN: No rash or itching.  CARDIOVASCULAR:  RESPIRATORY: No shortness of breath, cough or sputum.  GASTROINTESTINAL: No anorexia, nausea, vomiting or diarrhea. No abdominal pain or blood.  GENITOURINARY: No burning on urination, no polyuria NEUROLOGICAL: No headache, dizziness, syncope, paralysis, ataxia, numbness or tingling in the extremities.  No change in bowel or bladder control.  MUSCULOSKELETAL: No muscle, back pain, joint pain or stiffness.  LYMPHATICS: No enlarged nodes. No history of splenectomy.  PSYCHIATRIC: No history of depression or anxiety.  ENDOCRINOLOGIC: No reports of sweating, cold or heat intolerance. No polyuria or polydipsia.  Marland Kitchen   Physical Examination There were no vitals filed for this visit. There were no vitals filed for this visit.  Gen: resting comfortably, no acute distress HEENT: no scleral icterus, pupils equal round and reactive, no palptable cervical adenopathy,  CV Resp: Clear to auscultation bilaterally GI: abdomen is soft, non-tender, non-distended, normal bowel sounds, no hepatosplenomegaly MSK: extremities are warm, no edema.  Skin: warm, no  rash Neuro:  no focal deficits Psych: appropriate affect   Diagnostic Studies     Assessment and Plan        Antoine Poche, M.D., F.A.C.C.

## 2021-06-30 ENCOUNTER — Encounter: Payer: Self-pay | Admitting: Cardiology

## 2021-07-07 NOTE — Progress Notes (Signed)
Referring-John Z. Margo Aye, MD Reason for referral-chest pain  HPI: 66 year old female for evaluation of chest pain at request of Kathleene Hazel. Margo Aye, MD. Patient has dyspnea with more vigorous activities but not routine activities.  No orthopnea, PND, pedal edema or syncope.  She also occasionally has chest pain.  It is substernal and typically does not radiate.  Not clearly exertional.  Last several minutes and resolve spontaneously.  No associated symptoms.  She did state that she had an episode that had associated pain in her left shoulder and arm with some dyspnea.  Cardiology now asked to evaluate.  Current Outpatient Medications  Medication Sig Dispense Refill   aspirin EC 81 MG tablet Take 81 mg by mouth daily. Swallow whole.     DULoxetine (CYMBALTA) 60 MG capsule Take 60 mg by mouth daily.     losartan (COZAAR) 100 MG tablet Take 100 mg by mouth daily.     losartan-hydrochlorothiazide (HYZAAR) 50-12.5 MG per tablet Take 1 tablet by mouth daily.     omeprazole (PRILOSEC) 20 MG capsule Take 20 mg by mouth daily.     OMEPRAZOLE PO Take 10 mg by mouth daily.      No current facility-administered medications for this visit.    Allergies  Allergen Reactions   Codeine Nausea And Vomiting     Past Medical History:  Diagnosis Date   Arthralgia of hand    Arthralgia of shoulder    Cervical disc disorder    Chronic low back pain    Depression    GERD (gastroesophageal reflux disease)    Hyperlipidemia    Hypertension    Neck pain    Nicotine dependence    Osteoarthritis    Prediabetes    Seborrheic dermatitis     Past Surgical History:  Procedure Laterality Date   CHOLECYSTECTOMY     TUBAL LIGATION      Social History   Socioeconomic History   Marital status: Widowed    Spouse name: Not on file   Number of children: 4   Years of education: Not on file   Highest education level: Not on file  Occupational History   Not on file  Tobacco Use   Smoking status: Every Day     Packs/day: 0.50    Types: Cigarettes   Smokeless tobacco: Never  Substance and Sexual Activity   Alcohol use: Yes    Comment: Rare   Drug use: No   Sexual activity: Yes  Other Topics Concern   Not on file  Social History Narrative   Not on file   Social Determinants of Health   Financial Resource Strain: Not on file  Food Insecurity: Not on file  Transportation Needs: Not on file  Physical Activity: Not on file  Stress: Not on file  Social Connections: Not on file  Intimate Partner Violence: Not on file    Family History  Problem Relation Age of Onset   Heart attack Mother    Heart disease Mother    Diabetes Mother    Heart attack Sister    Heart attack Brother     ROS: no fevers or chills, productive cough, hemoptysis, dysphasia, odynophagia, melena, hematochezia, dysuria, hematuria, rash, seizure activity, orthopnea, PND, pedal edema, claudication. Remaining systems are negative.  Physical Exam:   Blood pressure 134/90, pulse 76, height 5\' 3"  (1.6 m), weight 154 lb (69.9 kg).  General:  Well developed/well nourished in NAD Skin warm/dry Patient not depressed No peripheral clubbing Back-normal  HEENT-normal/normal eyelids Neck supple/normal carotid upstroke bilaterally; no bruits; no JVD; no thyromegaly chest - CTA/ normal expansion CV - RRR/normal S1 and S2; no murmurs, rubs or gallops;  PMI nondisplaced Abdomen -NT/ND, no HSM, no mass, + bowel sounds, no bruit 2+ femoral pulses, no bruits Ext-no edema, chords, 2+ DP Neuro-grossly nonfocal  ECG -normal sinus rhythm at a rate of 76, normal axis, nonspecific ST changes.  Personally reviewed  A/P  1 chest pain-symptoms are somewhat atypical.  However she has multiple risk factors including tobacco abuse, strong family history, hypertension and hyperlipidemia.  We will plan cardiac CTA to rule out obstructive coronary disease.  2 hypertension-blood pressure is borderline.  We will follow and adjust regimen  as needed.  3 hyperlipidemia-we will await results of cardiac CTA.  If she has coronary calcification or any degree of coronary disease would plan to initiate statin therapy.  4 tobacco abuse-patient counseled on discontinuing.  Olga Millers, MD

## 2021-07-14 ENCOUNTER — Ambulatory Visit (INDEPENDENT_AMBULATORY_CARE_PROVIDER_SITE_OTHER): Payer: PRIVATE HEALTH INSURANCE | Admitting: Cardiology

## 2021-07-14 ENCOUNTER — Encounter: Payer: Self-pay | Admitting: Cardiology

## 2021-07-14 VITALS — BP 134/90 | HR 76 | Ht 63.0 in | Wt 154.0 lb

## 2021-07-14 DIAGNOSIS — Z720 Tobacco use: Secondary | ICD-10-CM | POA: Diagnosis not present

## 2021-07-14 DIAGNOSIS — E7800 Pure hypercholesterolemia, unspecified: Secondary | ICD-10-CM

## 2021-07-14 DIAGNOSIS — R072 Precordial pain: Secondary | ICD-10-CM | POA: Diagnosis not present

## 2021-07-14 DIAGNOSIS — I10 Essential (primary) hypertension: Secondary | ICD-10-CM

## 2021-07-14 MED ORDER — METOPROLOL TARTRATE 100 MG PO TABS: ORAL_TABLET | ORAL | 0 refills | Status: DC

## 2021-07-14 NOTE — Patient Instructions (Signed)
  Testing/Procedures:   Your cardiac CT will be scheduled at   [facility] [address] [place], [place] [address] [phone]    If scheduled at [facility], please arrive at the [facility] Entrance (Entrance C2) of [facility] 30 minutes prior to test start time. You can use the FREE valet parking offered at entrance C (encouraged to control the heart rate for the test)  Proceed to the [facility] Radiology Department (first floor) to check-in and test prep.  All radiology patients and guests should use entrance C2 at [facility], accessed from [facility], even though the hospital's physical address listed is [address].     Please follow these instructions carefully (unless otherwise directed):    On the Night Before the Test: Be sure to Drink plenty of water. Do not consume any caffeinated/decaffeinated beverages or chocolate 12 hours prior to your test. Do not take any antihistamines 12 hours prior to your test.   On the Day of the Test: Drink plenty of water until 1 hour prior to the test. Do not eat any food 4 hours prior to the test. You may take your regular medications prior to the test.  Take metoprolol (Lopressor) 100 mg two hours prior to test. HOLD Hydrochlorothiazide morning of the test. FEMALES- please wear underwire-free bra if available, avoid dresses & tight clothing      After the Test: Drink plenty of water. After receiving IV contrast, you may experience a mild flushed feeling. This is normal. On occasion, you may experience a mild rash up to 24 hours after the test. This is not dangerous. If this occurs, you can take Benadryl 25 mg and increase your fluid intake. If you experience trouble breathing, this can be serious. If it is severe call 911 IMMEDIATELY. If it is mild, please call our office.   We will call to schedule your test 2-4 weeks out understanding that some  insurance companies will need an authorization prior to the service being performed.   For non-scheduling related questions, please contact the cardiac imaging nurse navigator should you have any questions/concerns: Rockwell Alexandria, Cardiac Imaging Nurse Navigator Larey Brick, Cardiac Imaging Nurse Navigator [facility] Direct Office Dial: [phone]   For scheduling needs, including cancellations and rescheduling, please call [place], [phone].    Follow-Up: At [facility], you and your health needs are our priority.  As part of our continuing mission to provide you with exceptional heart care, we have created designated Provider Care Teams.  These Care Teams include your primary Cardiologist (physician) and Advanced Practice Providers (APPs -  Physician Assistants and Nurse Practitioners) who all work together to provide you with the care you need, when you need it.  We recommend signing up for the patient portal called "MyChart".  Sign up information is provided on this After Visit Summary.  MyChart is used to connect with patients for Virtual Visits (Telemedicine).  Patients are able to view lab/test results, encounter notes, upcoming appointments, etc.  Non-urgent messages can be sent to your provider as well.   To learn more about what you can do with MyChart, go to [email or web address].    Your next appointment:   6 month(s)  The format for your next appointment:   In Person  Provider:   Olga Millers MD     Important Information About Sugar

## 2021-08-04 ENCOUNTER — Ambulatory Visit (HOSPITAL_COMMUNITY): Admission: RE | Admit: 2021-08-04 | Payer: PRIVATE HEALTH INSURANCE | Source: Ambulatory Visit

## 2021-11-10 ENCOUNTER — Encounter (INDEPENDENT_AMBULATORY_CARE_PROVIDER_SITE_OTHER): Payer: Self-pay | Admitting: *Deleted

## 2021-11-24 ENCOUNTER — Other Ambulatory Visit (HOSPITAL_COMMUNITY): Payer: Self-pay | Admitting: Internal Medicine

## 2021-11-24 DIAGNOSIS — E782 Mixed hyperlipidemia: Secondary | ICD-10-CM

## 2021-11-24 DIAGNOSIS — Z1231 Encounter for screening mammogram for malignant neoplasm of breast: Secondary | ICD-10-CM

## 2022-01-05 NOTE — Progress Notes (Deleted)
     HPI: FU CP.  Patient seen with atypical chest pain in June 2023.  Cardiac CTA ordered but not performed.  Since last seen  Current Outpatient Medications  Medication Sig Dispense Refill   aspirin EC 81 MG tablet Take 81 mg by mouth daily. Swallow whole.     DULoxetine (CYMBALTA) 60 MG capsule Take 60 mg by mouth daily.     hydrochlorothiazide (MICROZIDE) 12.5 MG capsule Take 12.5 mg by mouth daily.     losartan (COZAAR) 100 MG tablet Take 100 mg by mouth daily.     metoprolol tartrate (LOPRESSOR) 100 MG tablet Take 2 hours prior to CT scan 1 tablet 0   omeprazole (PRILOSEC) 20 MG capsule Take 20 mg by mouth daily.     OMEPRAZOLE PO Take 10 mg by mouth daily.      No current facility-administered medications for this visit.     Past Medical History:  Diagnosis Date   Arthralgia of hand    Arthralgia of shoulder    Cervical disc disorder    Chronic low back pain    Depression    GERD (gastroesophageal reflux disease)    Hyperlipidemia    Hypertension    Neck pain    Nicotine dependence    Osteoarthritis    Prediabetes    Seborrheic dermatitis     Past Surgical History:  Procedure Laterality Date   CHOLECYSTECTOMY     TUBAL LIGATION      Social History   Socioeconomic History   Marital status: Widowed    Spouse name: Not on file   Number of children: 4   Years of education: Not on file   Highest education level: Not on file  Occupational History   Not on file  Tobacco Use   Smoking status: Every Day    Packs/day: 0.50    Types: Cigarettes   Smokeless tobacco: Never  Substance and Sexual Activity   Alcohol use: Yes    Comment: Rare   Drug use: No   Sexual activity: Yes  Other Topics Concern   Not on file  Social History Narrative   Not on file   Social Determinants of Health   Financial Resource Strain: Not on file  Food Insecurity: Not on file  Transportation Needs: Not on file  Physical Activity: Not on file  Stress: Not on file  Social  Connections: Not on file  Intimate Partner Violence: Not on file    Family History  Problem Relation Age of Onset   Heart attack Mother    Heart disease Mother    Diabetes Mother    Heart attack Sister    Heart attack Brother     ROS: no fevers or chills, productive cough, hemoptysis, dysphasia, odynophagia, melena, hematochezia, dysuria, hematuria, rash, seizure activity, orthopnea, PND, pedal edema, claudication. Remaining systems are negative.  Physical Exam: Well-developed well-nourished in no acute distress.  Skin is warm and dry.  HEENT is normal.  Neck is supple.  Chest is clear to auscultation with normal expansion.  Cardiovascular exam is regular rate and rhythm.  Abdominal exam nontender or distended. No masses palpated. Extremities show no edema. neuro grossly intact  ECG- personally reviewed  A/P  1 coronary artery disease-  2 hypertension-patient's blood pressure is controlled.  Continue present medications.  3 hyperlipidemia-  4 tobacco abuse-patient counseled on discontinuing.  Olga Millers, MD

## 2022-01-07 ENCOUNTER — Encounter (HOSPITAL_COMMUNITY): Payer: Self-pay

## 2022-01-07 ENCOUNTER — Ambulatory Visit (HOSPITAL_COMMUNITY): Payer: Self-pay

## 2022-01-15 ENCOUNTER — Ambulatory Visit: Payer: PRIVATE HEALTH INSURANCE | Admitting: Cardiology

## 2022-04-08 ENCOUNTER — Encounter: Payer: Self-pay | Admitting: Radiology

## 2022-05-25 ENCOUNTER — Other Ambulatory Visit (HOSPITAL_COMMUNITY): Payer: Self-pay | Admitting: Internal Medicine

## 2022-05-25 DIAGNOSIS — Z1231 Encounter for screening mammogram for malignant neoplasm of breast: Secondary | ICD-10-CM

## 2022-05-26 ENCOUNTER — Ambulatory Visit (HOSPITAL_COMMUNITY)
Admission: RE | Admit: 2022-05-26 | Discharge: 2022-05-26 | Disposition: A | Discharge status: Home / Self Care | Payer: Self-pay | Source: Ambulatory Visit | Attending: Internal Medicine | Admitting: Internal Medicine

## 2022-05-26 DIAGNOSIS — E782 Mixed hyperlipidemia: Secondary | ICD-10-CM | POA: Insufficient documentation

## 2022-06-09 ENCOUNTER — Encounter: Payer: Self-pay | Admitting: Cardiology

## 2022-06-09 NOTE — Telephone Encounter (Signed)
Error

## 2022-07-13 ENCOUNTER — Encounter: Payer: Self-pay | Admitting: Nurse Practitioner

## 2022-07-13 ENCOUNTER — Ambulatory Visit: Payer: PRIVATE HEALTH INSURANCE | Attending: Nurse Practitioner | Admitting: Nurse Practitioner

## 2022-07-13 ENCOUNTER — Encounter: Payer: Self-pay | Admitting: *Deleted

## 2022-07-13 VITALS — BP 148/92 | HR 73 | Ht 63.0 in | Wt 155.2 lb

## 2022-07-13 DIAGNOSIS — I700 Atherosclerosis of aorta: Secondary | ICD-10-CM

## 2022-07-13 DIAGNOSIS — I10 Essential (primary) hypertension: Secondary | ICD-10-CM | POA: Diagnosis not present

## 2022-07-13 DIAGNOSIS — E785 Hyperlipidemia, unspecified: Secondary | ICD-10-CM

## 2022-07-13 DIAGNOSIS — Z720 Tobacco use: Secondary | ICD-10-CM

## 2022-07-13 DIAGNOSIS — I2510 Atherosclerotic heart disease of native coronary artery without angina pectoris: Secondary | ICD-10-CM

## 2022-07-13 DIAGNOSIS — I2584 Coronary atherosclerosis due to calcified coronary lesion: Secondary | ICD-10-CM

## 2022-07-13 NOTE — Progress Notes (Signed)
Office Visit    Patient Name: Brooke Webb Date of Encounter: 07/13/2022  PCP:  Benita Stabile, MD   [facility]  Cardiologist:  Olga Millers, MD  Advanced Practice Provider:  No care team member to display Electrophysiologist:  None  [id number]}  Chief Complaint    Anesia Huther is a 67 y.o. female with a hx of coronary artery calcification, aortic atherosclerosis, chest pain, dyspnea on exertion, hypertension, hyperlipidemia, tobacco abuse, GERD, who presents today for 1 year follow-up.  Past Medical History    Past Medical History:  Diagnosis Date   Arthralgia of hand    Arthralgia of shoulder    Cervical disc disorder    Chronic low back pain    Depression    GERD (gastroesophageal reflux disease)    Hyperlipidemia    Hypertension    Neck pain    Nicotine dependence    Osteoarthritis    Prediabetes    Seborrheic dermatitis    Past Surgical History:  Procedure Laterality Date   CHOLECYSTECTOMY     TUBAL LIGATION      Allergies  Allergies  Allergen Reactions   Codeine Nausea And Vomiting    History of Present Illness    Zakiyah Dasari is a 67 y.o. female with a PMH mentioned above.  Was referred to cardiology services last year.  Saw Dr. Jens Som and reported dyspnea with vigorous activities, noted occasional substernal chest pain, radiating, not related to exertion, brief in duration.  CT cardiac scoring revealed coronary calcium score 663, aortic atherosclerosis noted.  Patient presents for 1 year follow-up.  She states she is doing well.  Denies any acute cardiac complaints or issues. Says she stays pretty active doing yard work Denies any chest pain, shortness of breath, palpitations, syncope, presyncope, dizziness, orthopnea, PND, swelling or significant weight changes, acute bleeding, or claudication.  Does not check blood pressure at home.  Reports smoking 1 pack/day.  Denies any alcohol or illicit drug use.  EKGs/Labs/Other  Studies Reviewed:   The following studies were reviewed today:   EKG:  EKG is ordered today.  The ekg ordered today demonstrates normal sinus rhythm, 69 bpm, LVH with repolarization abnormality, no acute ischemic changes.  CT cardiac scoring 05/2022: IMPRESSION: 1. Coronary calcium score of 663. This was 97th percentile for age-, race-, and sex-matched controls.   2.  Aortic atherosclerosis.  Recent Labs: No results found for requested labs within last 365 days.  Recent Lipid Panel No results found for: "CHOL", "TRIG", "HDL", "CHOLHDL", "VLDL", "LDLCALC", "LDLDIRECT"    Home Medications   Current Meds  Medication Sig   aspirin EC 81 MG tablet Take 81 mg by mouth daily. Swallow whole.   DULoxetine (CYMBALTA) 60 MG capsule Take 60 mg by mouth daily.   losartan (COZAAR) 100 MG tablet Take 100 mg by mouth daily.   omeprazole (PRILOSEC) 20 MG capsule Take 20 mg by mouth daily.     Review of Systems    All other systems reviewed and are otherwise negative except as noted above.  Physical Exam    VS:  BP (!) 148/92   Pulse 73   Ht 5\' 3"  (1.6 m)   Wt 155 lb 3.2 oz (70.4 kg)   SpO2 95%   BMI 27.49 kg/m  , BMI Body mass index is 27.49 kg/m.  Repeat BP: 150/80  Wt Readings from Last 3 Encounters:  07/13/22 155 lb 3.2 oz (70.4 kg)  07/14/21 154 lb (69.9 kg)  05/04/21 158 lb (71.7 kg)     GEN: Well nourished, well developed, in no acute distress. HEENT: normal. Neck: Supple, no JVD, carotid bruits, or masses. Cardiac: S1/S2, RRR, no murmurs, rubs, or gallops. No clubbing, cyanosis, edema.  Radials/PT 2+ and equal bilaterally.  Respiratory:  Respirations regular and unlabored, clear to auscultation bilaterally. GI: Soft, nontender, nondistended. MS: No deformity or atrophy. Skin: Warm and dry, no rash. Neuro:  Strength and sensation are intact. Psych: Normal affect.  Assessment & Plan    Coronary artery calcification, aortic atherosclerosis Stable with no anginal  symptoms. No indication for ischemic evaluation.  CT cardiac scoring arranged by PCP revealed coronary calcium score 663 with evidence of aortic atherosclerosis.  Continue aspirin and losartan.  Discussed lifestyle modifications, she verbalized understanding.  Will request most recent labs from PCP's office. Heart healthy diet and regular cardiovascular exercise encouraged.   Hypertension Blood pressure elevated today in office, does not check BP at home.  Given BP log and discussed to monitor BP at home at least 2 hours after medications and sitting for 5-10 minutes.  Discussed SBP goal is less than 130.  Stated SBP is not at goal in the next 2 to 3 weeks, she needs to contact her office, and would recommend switching losartan to valsartan for better BP control. Heart healthy diet and regular cardiovascular exercise encouraged.   Hyperlipidemia Do not have recent labs on file.  Will request most recent labs from PCP's office. Heart healthy diet and regular cardiovascular exercise encouraged.   Tobacco abuse Smoking cessation encouraged and discussed.   Disposition: Follow up in 1 year(s) with Olga Millers, MD or APP.  Requesting care closer to home.  We will arrange follow-up in 1 year with Dr. Jenene Slicker or APP.  Signed, Sharlene Dory, NP 07/13/2022, 1:51 PM [facility]

## 2022-07-13 NOTE — Patient Instructions (Addendum)
Medication Instructions:   Continue all current medications.   Labwork:  none  Testing/Procedures:  none  Follow-Up:  Your physician wants you to follow up in:  1 year.  You should receive a recall letter in the mail about 2 months prior to the time you are due.  If you don't receive this, please call our office to schedule your follow up appointment.      Any Other Special Instructions Will Be Listed Below (If Applicable).  BP log Salty six Mediterranean diet info   If you need a refill on your cardiac medications before your next appointment, please call your pharmacy.

## 2022-11-29 ENCOUNTER — Encounter: Payer: Self-pay | Admitting: Nurse Practitioner

## 2022-12-01 ENCOUNTER — Encounter (INDEPENDENT_AMBULATORY_CARE_PROVIDER_SITE_OTHER): Payer: Self-pay | Admitting: *Deleted

## 2023-01-03 DIAGNOSIS — H9313 Tinnitus, bilateral: Secondary | ICD-10-CM | POA: Diagnosis not present

## 2023-01-03 DIAGNOSIS — J0100 Acute maxillary sinusitis, unspecified: Secondary | ICD-10-CM | POA: Diagnosis not present

## 2023-01-03 DIAGNOSIS — H6993 Unspecified Eustachian tube disorder, bilateral: Secondary | ICD-10-CM | POA: Diagnosis not present

## 2023-01-03 DIAGNOSIS — F17200 Nicotine dependence, unspecified, uncomplicated: Secondary | ICD-10-CM | POA: Diagnosis not present

## 2023-03-11 ENCOUNTER — Other Ambulatory Visit: Payer: Self-pay | Admitting: Family Medicine

## 2023-03-20 ENCOUNTER — Encounter (HOSPITAL_COMMUNITY): Payer: Self-pay

## 2023-03-20 ENCOUNTER — Other Ambulatory Visit: Payer: Self-pay

## 2023-03-20 ENCOUNTER — Emergency Department (HOSPITAL_COMMUNITY)
Admission: EM | Admit: 2023-03-20 | Discharge: 2023-03-21 | Discharge status: Against Medical Advice | Payer: Medicare HMO | Attending: Emergency Medicine | Admitting: Emergency Medicine

## 2023-03-20 ENCOUNTER — Emergency Department (HOSPITAL_COMMUNITY): Payer: Medicare HMO

## 2023-03-20 DIAGNOSIS — R0602 Shortness of breath: Secondary | ICD-10-CM | POA: Insufficient documentation

## 2023-03-20 DIAGNOSIS — F17200 Nicotine dependence, unspecified, uncomplicated: Secondary | ICD-10-CM | POA: Diagnosis not present

## 2023-03-20 DIAGNOSIS — R079 Chest pain, unspecified: Secondary | ICD-10-CM | POA: Insufficient documentation

## 2023-03-20 DIAGNOSIS — Z5321 Procedure and treatment not carried out due to patient leaving prior to being seen by health care provider: Secondary | ICD-10-CM | POA: Insufficient documentation

## 2023-03-20 DIAGNOSIS — I700 Atherosclerosis of aorta: Secondary | ICD-10-CM | POA: Diagnosis not present

## 2023-03-20 DIAGNOSIS — R0789 Other chest pain: Secondary | ICD-10-CM | POA: Diagnosis not present

## 2023-03-20 LAB — TROPONIN I (HIGH SENSITIVITY)
Troponin I (High Sensitivity): 4 ng/L (ref <18)
Troponin I (High Sensitivity): 4 ng/L (ref <18)

## 2023-03-20 LAB — CBC
HCT: 39.1 % (ref 36.0–46.0)
Hemoglobin: 12.9 g/dL (ref 12.0–15.0)
MCH: 30.4 pg (ref 26.0–34.0)
MCHC: 33 g/dL (ref 30.0–36.0)
MCV: 92 fL (ref 80.0–100.0)
Platelets: 268 10*3/uL (ref 150–400)
RBC: 4.25 MIL/uL (ref 3.87–5.11)
RDW: 14.6 % (ref 11.5–15.5)
WBC: 7.3 10*3/uL (ref 4.0–10.5)
nRBC: 0 % (ref 0.0–0.2)

## 2023-03-20 LAB — BASIC METABOLIC PANEL
Anion gap: 11 (ref 5–15)
BUN: 11 mg/dL (ref 8–23)
CO2: 23 mmol/L (ref 22–32)
Calcium: 9 mg/dL (ref 8.9–10.3)
Chloride: 104 mmol/L (ref 98–111)
Creatinine, Ser: 0.65 mg/dL (ref 0.44–1.00)
GFR, Estimated: >60 mL/min (ref >60)
Glucose, Bld: 96 mg/dL (ref 70–99)
Potassium: 3.8 mmol/L (ref 3.5–5.1)
Sodium: 138 mmol/L (ref 135–145)

## 2023-03-20 NOTE — ED Triage Notes (Signed)
 Pt to ED c/o CP and SHOB that started today, intermittent in nature. Reports chronic hx of indigestion. Reports took Aspirin 650 mg at home. NAD in triage. Reports current smoker. Denies cardiac hx.

## 2023-03-20 NOTE — ED Provider Triage Note (Signed)
 Emergency Medicine Provider Triage Evaluation Note  Brooke Webb , a 68 y.o. female  was evaluated in triage.  Pt complains of CP x 1 day. Took 650mg  tylenol at 1720  Review of Systems  Positive: CP, neck pain, SHOB, current tobacco use, nausea Negative: Cardiac Hx, fever, chills, V/D, weakness, cough, congestion  Physical Exam  There were no vitals taken for this visit. Gen:   Awake, no distress   Resp:  Normal effort  MSK:   Moves extremities without difficulty  Other:    Medical Decision Making  Medically screening exam initiated at 6:08 PM.  Appropriate orders placed.  Brooke Webb was informed that the remainder of the evaluation will be completed by another provider, this initial triage assessment does not replace that evaluation, and the importance of remaining in the ED until their evaluation is complete.  Labs and imaging ordered   Brooke Webb 03/20/23 [id number]

## 2023-03-21 NOTE — ED Notes (Signed)
Called for vitals x3 no answer °

## 2023-03-22 DIAGNOSIS — G8929 Other chronic pain: Secondary | ICD-10-CM | POA: Diagnosis not present

## 2023-03-22 DIAGNOSIS — M542 Cervicalgia: Secondary | ICD-10-CM | POA: Diagnosis not present

## 2023-03-22 DIAGNOSIS — R0789 Other chest pain: Secondary | ICD-10-CM | POA: Diagnosis not present

## 2023-03-22 DIAGNOSIS — M545 Low back pain, unspecified: Secondary | ICD-10-CM | POA: Diagnosis not present

## 2023-04-01 DIAGNOSIS — R21 Rash and other nonspecific skin eruption: Secondary | ICD-10-CM | POA: Diagnosis not present

## 2023-06-01 ENCOUNTER — Encounter (INDEPENDENT_AMBULATORY_CARE_PROVIDER_SITE_OTHER): Payer: Self-pay | Admitting: *Deleted

## 2023-06-14 DIAGNOSIS — R7303 Prediabetes: Secondary | ICD-10-CM | POA: Diagnosis not present

## 2023-06-14 DIAGNOSIS — E782 Mixed hyperlipidemia: Secondary | ICD-10-CM | POA: Diagnosis not present

## 2023-06-21 ENCOUNTER — Other Ambulatory Visit (HOSPITAL_COMMUNITY): Payer: Self-pay | Admitting: Internal Medicine

## 2023-06-21 DIAGNOSIS — R7303 Prediabetes: Secondary | ICD-10-CM | POA: Diagnosis not present

## 2023-06-21 DIAGNOSIS — E782 Mixed hyperlipidemia: Secondary | ICD-10-CM | POA: Diagnosis not present

## 2023-06-21 DIAGNOSIS — F339 Major depressive disorder, recurrent, unspecified: Secondary | ICD-10-CM | POA: Diagnosis not present

## 2023-06-21 DIAGNOSIS — I10 Essential (primary) hypertension: Secondary | ICD-10-CM | POA: Diagnosis not present

## 2023-06-21 DIAGNOSIS — Z0001 Encounter for general adult medical examination with abnormal findings: Secondary | ICD-10-CM | POA: Diagnosis not present

## 2023-06-21 DIAGNOSIS — M6530 Trigger finger, unspecified finger: Secondary | ICD-10-CM | POA: Diagnosis not present

## 2023-06-21 DIAGNOSIS — I2510 Atherosclerotic heart disease of native coronary artery without angina pectoris: Secondary | ICD-10-CM | POA: Diagnosis not present

## 2023-06-21 DIAGNOSIS — Z1231 Encounter for screening mammogram for malignant neoplasm of breast: Secondary | ICD-10-CM

## 2023-06-21 DIAGNOSIS — Z13820 Encounter for screening for osteoporosis: Secondary | ICD-10-CM

## 2023-06-21 DIAGNOSIS — K219 Gastro-esophageal reflux disease without esophagitis: Secondary | ICD-10-CM | POA: Diagnosis not present

## 2023-06-21 DIAGNOSIS — F17210 Nicotine dependence, cigarettes, uncomplicated: Secondary | ICD-10-CM | POA: Diagnosis not present

## 2023-07-12 DIAGNOSIS — R7989 Other specified abnormal findings of blood chemistry: Secondary | ICD-10-CM | POA: Diagnosis not present

## 2023-07-12 DIAGNOSIS — I10 Essential (primary) hypertension: Secondary | ICD-10-CM | POA: Diagnosis not present

## 2023-07-12 DIAGNOSIS — N289 Disorder of kidney and ureter, unspecified: Secondary | ICD-10-CM | POA: Diagnosis not present

## 2023-07-12 DIAGNOSIS — D696 Thrombocytopenia, unspecified: Secondary | ICD-10-CM | POA: Diagnosis not present

## 2023-07-12 DIAGNOSIS — E782 Mixed hyperlipidemia: Secondary | ICD-10-CM | POA: Diagnosis not present

## 2023-09-30 ENCOUNTER — Encounter: Payer: Self-pay | Admitting: Radiology

## 2023-10-18 DIAGNOSIS — R7303 Prediabetes: Secondary | ICD-10-CM | POA: Diagnosis not present

## 2023-10-18 DIAGNOSIS — E782 Mixed hyperlipidemia: Secondary | ICD-10-CM | POA: Diagnosis not present

## 2023-10-24 ENCOUNTER — Encounter: Payer: Self-pay | Admitting: Internal Medicine

## 2023-10-24 ENCOUNTER — Other Ambulatory Visit (HOSPITAL_COMMUNITY): Payer: Self-pay | Admitting: Internal Medicine

## 2023-10-24 DIAGNOSIS — F17210 Nicotine dependence, cigarettes, uncomplicated: Secondary | ICD-10-CM | POA: Diagnosis not present

## 2023-10-24 DIAGNOSIS — I10 Essential (primary) hypertension: Secondary | ICD-10-CM | POA: Diagnosis not present

## 2023-10-24 DIAGNOSIS — K219 Gastro-esophageal reflux disease without esophagitis: Secondary | ICD-10-CM | POA: Diagnosis not present

## 2023-10-24 DIAGNOSIS — F17200 Nicotine dependence, unspecified, uncomplicated: Secondary | ICD-10-CM

## 2023-10-24 DIAGNOSIS — R7989 Other specified abnormal findings of blood chemistry: Secondary | ICD-10-CM | POA: Diagnosis not present

## 2023-10-24 DIAGNOSIS — N289 Disorder of kidney and ureter, unspecified: Secondary | ICD-10-CM | POA: Diagnosis not present

## 2023-10-24 DIAGNOSIS — R7303 Prediabetes: Secondary | ICD-10-CM | POA: Diagnosis not present

## 2023-10-24 DIAGNOSIS — F331 Major depressive disorder, recurrent, moderate: Secondary | ICD-10-CM | POA: Diagnosis not present

## 2023-10-24 DIAGNOSIS — E782 Mixed hyperlipidemia: Secondary | ICD-10-CM | POA: Diagnosis not present

## 2023-10-24 DIAGNOSIS — D696 Thrombocytopenia, unspecified: Secondary | ICD-10-CM | POA: Diagnosis not present

## 2023-10-28 ENCOUNTER — Ambulatory Visit (HOSPITAL_COMMUNITY)
Admission: RE | Admit: 2023-10-28 | Discharge: 2023-10-28 | Disposition: A | Discharge status: Home / Self Care | Source: Ambulatory Visit | Attending: Internal Medicine | Admitting: Internal Medicine

## 2023-10-28 DIAGNOSIS — F17210 Nicotine dependence, cigarettes, uncomplicated: Secondary | ICD-10-CM | POA: Diagnosis not present

## 2023-10-28 DIAGNOSIS — F17200 Nicotine dependence, unspecified, uncomplicated: Secondary | ICD-10-CM | POA: Diagnosis present

## 2023-10-28 DIAGNOSIS — I700 Atherosclerosis of aorta: Secondary | ICD-10-CM | POA: Diagnosis not present

## 2023-10-28 DIAGNOSIS — Z122 Encounter for screening for malignant neoplasm of respiratory organs: Secondary | ICD-10-CM | POA: Insufficient documentation

## 2023-10-28 DIAGNOSIS — I2510 Atherosclerotic heart disease of native coronary artery without angina pectoris: Secondary | ICD-10-CM | POA: Diagnosis not present

## 2023-10-28 DIAGNOSIS — J439 Emphysema, unspecified: Secondary | ICD-10-CM | POA: Diagnosis not present

## 2023-11-04 ENCOUNTER — Ambulatory Visit (HOSPITAL_COMMUNITY)
Admission: RE | Admit: 2023-11-04 | Discharge: 2023-11-04 | Disposition: A | Discharge status: Home / Self Care | Source: Ambulatory Visit | Attending: Internal Medicine | Admitting: Internal Medicine

## 2023-11-04 DIAGNOSIS — Z13820 Encounter for screening for osteoporosis: Secondary | ICD-10-CM

## 2023-11-04 DIAGNOSIS — M810 Age-related osteoporosis without current pathological fracture: Secondary | ICD-10-CM | POA: Diagnosis not present

## 2023-11-04 DIAGNOSIS — Z780 Asymptomatic menopausal state: Secondary | ICD-10-CM | POA: Diagnosis not present

## 2023-11-04 DIAGNOSIS — Z1231 Encounter for screening mammogram for malignant neoplasm of breast: Secondary | ICD-10-CM | POA: Insufficient documentation

## 2023-11-10 ENCOUNTER — Other Ambulatory Visit (HOSPITAL_COMMUNITY): Payer: Self-pay | Admitting: Pharmacy Technician

## 2023-11-10 DIAGNOSIS — M810 Age-related osteoporosis without current pathological fracture: Secondary | ICD-10-CM | POA: Insufficient documentation

## 2023-11-11 ENCOUNTER — Telehealth: Payer: Self-pay

## 2023-11-11 NOTE — Telephone Encounter (Signed)
 Auth Submission: APPROVED Site of care: Site of care: [facility] Payer: [employer] medicare Medication & CPT/J Code(s) submitted: Prolia (Denosumab) [id number] Diagnosis Code:  Route of submission (phone, fax, portal): portal Phone # Fax # Auth type: Buy/Bill PB Units/visits requested: 60mg  q35months x3doses Reference number: [id number] Approval from: 11/10/23 to 02/07/25

## 2023-11-22 DIAGNOSIS — F331 Major depressive disorder, recurrent, moderate: Secondary | ICD-10-CM | POA: Diagnosis not present

## 2023-11-22 DIAGNOSIS — E782 Mixed hyperlipidemia: Secondary | ICD-10-CM | POA: Diagnosis not present

## 2023-11-22 DIAGNOSIS — K219 Gastro-esophageal reflux disease without esophagitis: Secondary | ICD-10-CM | POA: Diagnosis not present

## 2023-11-22 DIAGNOSIS — I10 Essential (primary) hypertension: Secondary | ICD-10-CM | POA: Diagnosis not present

## 2023-11-22 DIAGNOSIS — F17210 Nicotine dependence, cigarettes, uncomplicated: Secondary | ICD-10-CM | POA: Diagnosis not present

## 2023-11-22 DIAGNOSIS — I2510 Atherosclerotic heart disease of native coronary artery without angina pectoris: Secondary | ICD-10-CM | POA: Diagnosis not present

## 2023-11-22 DIAGNOSIS — D3500 Benign neoplasm of unspecified adrenal gland: Secondary | ICD-10-CM | POA: Diagnosis not present

## 2023-11-22 DIAGNOSIS — M810 Age-related osteoporosis without current pathological fracture: Secondary | ICD-10-CM | POA: Diagnosis not present

## 2023-11-22 DIAGNOSIS — I700 Atherosclerosis of aorta: Secondary | ICD-10-CM | POA: Diagnosis not present

## 2023-11-24 ENCOUNTER — Encounter: Attending: Internal Medicine | Admitting: *Deleted

## 2023-11-24 VITALS — BP 148/79 | HR 72 | Temp 98.0°F | Resp 18

## 2023-11-24 DIAGNOSIS — M810 Age-related osteoporosis without current pathological fracture: Secondary | ICD-10-CM | POA: Diagnosis not present

## 2023-11-24 MED ORDER — DENOSUMAB 60 MG/ML ~~LOC~~ SOSY
60.0000 mg | PREFILLED_SYRINGE | Freq: Once | SUBCUTANEOUS | Status: AC
  Administered 2023-11-24: 60 mg via SUBCUTANEOUS

## 2023-11-24 NOTE — Progress Notes (Signed)
 Diagnosis: Osteoporosis  Provider:  Dwana Melena MD  Procedure: Injection  Prolia (Denosumab), Dose: 60 mg, Site: subcutaneous, Number of injections: 1  Injection Site(s): Left arm  Post Care: Observation period completed  Discharge: Condition: Good, Destination: Home . AVS Provided  Performed by:  Daleen Squibb, RN

## 2023-12-08 ENCOUNTER — Encounter: Payer: Self-pay | Admitting: Orthopedic Surgery

## 2023-12-08 ENCOUNTER — Other Ambulatory Visit (INDEPENDENT_AMBULATORY_CARE_PROVIDER_SITE_OTHER): Payer: Self-pay

## 2023-12-08 ENCOUNTER — Ambulatory Visit: Admitting: Orthopedic Surgery

## 2023-12-08 VITALS — BP 132/72 | Ht 62.0 in | Wt 138.0 lb

## 2023-12-08 DIAGNOSIS — M25511 Pain in right shoulder: Secondary | ICD-10-CM

## 2023-12-08 DIAGNOSIS — M25311 Other instability, right shoulder: Secondary | ICD-10-CM

## 2023-12-08 DIAGNOSIS — M7541 Impingement syndrome of right shoulder: Secondary | ICD-10-CM | POA: Diagnosis not present

## 2023-12-08 MED ORDER — IBUPROFEN 400 MG PO TABS: 400.0000 mg | ORAL_TABLET | Freq: Three times a day (TID) | ORAL | 0 refills | Status: AC | PRN

## 2023-12-08 MED ORDER — METHYLPREDNISOLONE ACETATE 40 MG/ML IJ SUSP
40.0000 mg | Freq: Once | INTRAMUSCULAR | Status: AC
  Administered 2023-12-08: 40 mg via INTRA_ARTICULAR

## 2023-12-08 NOTE — Progress Notes (Signed)
  Intake history:  Chief Complaint  Patient presents with   Shoulder Pain    Right      BP 132/72 Comment: 11/22/23  Ht 5' 2 (1.575 m)   Wt 138 lb (62.6 kg)   BMI 25.24 kg/m  Body mass index is 25.24 kg/m.  Pharmacy? _________[facility]_____________  WHAT ARE WE SEEING YOU FOR TODAY?   Right shoulder   How long has this bothered you? (DOI?DOS?WS?)  2 MONTHS   Was there an injury? No  Anticoag.  No   Any ALLERGIES ______ Allergies  Allergen Reactions   Codeine Nausea And Vomiting   ________________________________________   Treatment:  Have you taken:  Tylenol Yes  Advil No  Had PT No  Had injection No  Other  _________________________

## 2023-12-08 NOTE — Addendum Note (Signed)
 Addended by: VICENTA EMMIE HERO on: 12/08/2023 12:01 PM   Modules accepted: Orders

## 2023-12-08 NOTE — Patient Instructions (Signed)
 For pain control  Start with a heating pad for 20 minutes  Take Tylenol 500 mg 3 times a day  Take ibuprofen 400 mg every 8 hours as needed  Joint Steroid Injection A joint steroid injection is a procedure to relieve swelling and pain in a joint. Steroids are medicines that reduce inflammation. In this procedure, your health care provider uses a syringe and a needle to inject a steroid medicine into a painful and inflamed joint. A pain-relieving medicine (anesthetic) may be injected along with the steroid. In some cases, your health care provider may use an imaging technique such as ultrasound or fluoroscopy to guide the injection. Joints that are often treated with steroid injections include the knee, shoulder, hip, and spine. These injections may also be used in the elbow, ankle, and joints of the hands or feet. You may have joint steroid injections as part of your treatment for inflammation caused by: Gout. Rheumatoid arthritis. Advanced wear-and-tear arthritis (osteoarthritis). Tendinitis. Bursitis. Joint steroid injections may be repeated, but having them too often can damage a joint or the skin over the joint. You should not have joint steroid injections less than 6 weeks apart or more than four times a year. Tell a health care provider about: Any allergies you have. All medicines you are taking, including vitamins, herbs, eye drops, creams, and over-the-counter medicines. Any problems you or family members have had with anesthetic medicines. Any blood disorders you have. Any surgeries you have had. Any medical conditions you have. Whether you are pregnant or may be pregnant. What are the risks? Generally, this is a safe treatment. However, problems may occur, including: Infection. Bleeding. Allergic reactions to medicines. Damage to the joint or tissues around the joint. Thinning of skin or loss of skin color over the joint. Temporary flushing of the face or chest. Temporary  increase in pain. Temporary increase in blood sugar. Failure to relieve inflammation or pain. What happens before the treatment? Medicines Ask your health care provider about: Changing or stopping your regular medicines. This is especially important if you are taking diabetes medicines or blood thinners. Taking medicines such as aspirin and ibuprofen. These medicines can thin your blood. Do not take these medicines unless your health care provider tells you to take them. Taking over-the-counter medicines, vitamins, herbs, and supplements. General instructions You may have imaging tests of your joint. Ask your health care provider if you can drive yourself home after the procedure. What happens during the treatment?  Your health care provider will position you for the injection and locate the injection site over your joint. The skin over the joint will be cleaned with a germ-killing soap. Your health care provider may: Spray a numbing solution (topical anesthetic) over the injection site. Inject a local anesthetic under the skin above your joint. The needle will be placed through your skin into your joint. Your health care provider may use imaging to guide the needle to the right spot for the injection. If imaging is used, a special contrast dye may be injected to confirm that the needle is in the correct location. The steroid medicine will be injected into your joint. Anesthetic may be injected along with the steroid. This may be a medicine that relieves pain for a short time (short-acting anesthetic) or for a longer time (long-acting anesthetic). The needle will be removed, and an adhesive bandage (dressing) will be placed over the injection site. The procedure may vary among health care providers and hospitals. What can I expect  after the treatment? You will be able to go home after the treatment. It is normal to feel slight flushing for a few days after the injection. After the  treatment, it is common to have an increase in joint pain after the anesthetic has worn off. This may happen about an hour after a short-acting anesthetic or about 8 hours after a longer-acting anesthetic. You should begin to feel relief from joint pain and swelling after 24 to 48 hours. Contact your health care provider if you do not begin to feel relief after 2 days. Follow these instructions at home: Injection site care Leave the adhesive dressing over your injection site in place until your health care provider says you can remove it. Check your injection site every day for signs of infection. Check for: More redness, swelling, or pain. Fluid or blood. Warmth. Pus or a bad smell. Activity Return to your normal activities as told by your health care provider. Ask your health care provider what activities are safe for you. You may be asked to limit activities that put stress on the joint for a few days. Do joint exercises as told by your health care provider. Do not take baths, swim, or use a hot tub until your health care provider approves. Ask your health care provider if you may take showers. You may only be allowed to take sponge baths. Managing pain, stiffness, and swelling  If directed, put ice on the joint. To do this: Put ice in a plastic bag. Place a towel between your skin and the bag. Leave the ice on for 20 minutes, 2-3 times a day. Remove the ice if your skin turns bright red. This is very important. If you cannot feel pain, heat, or cold, you have a greater risk of damage to the area. Raise (elevate) your joint above the level of your heart when you are sitting or lying down. General instructions Take over-the-counter and prescription medicines only as told by your health care provider. Do not use any products that contain nicotine or tobacco, such as cigarettes, e-cigarettes, and chewing tobacco. These can delay joint healing. If you need help quitting, ask your health care  provider. If you have diabetes, be aware that your blood sugar may be slightly elevated for several days after the injection. Keep all follow-up visits. This is important. Contact a health care provider if you have: Chills or a fever. Any signs of infection at your injection site. Increased pain or swelling or no relief after 2 days. Summary A joint steroid injection is a treatment to relieve pain and swelling in a joint. Steroids are medicines that reduce inflammation. Your health care provider may add an anesthetic along with the steroid. You may have joint steroid injections as part of your arthritis treatment. Joint steroid injections may be repeated, but having them too often can damage a joint or the skin over the joint. Contact your health care provider if you have a fever, chills, or signs of infection, or if you get no relief from joint pain or swelling. This information is not intended to replace advice given to you by your health care provider. Make sure you discuss any questions you have with your health care provider. Document Revised: 07/06/2019 Document Reviewed: 07/06/2019 Elsevier Patient Education  2024 [employer].

## 2023-12-08 NOTE — Progress Notes (Signed)
 Office Visit Note   Patient: Brooke Webb           Date of Birth: Jul 11, 1955           MRN: [id number] Visit Date: 12/08/2023 Requested by: Shona Norleen PEDLAR, MD [address] [place],  [place] [address] PCP: Shona Norleen PEDLAR, MD   Assessment & Plan:   Encounter Diagnoses  Name Primary?   Acute pain of right shoulder Yes   Rotator cuff insufficiency of right shoulder    Impingement syndrome of right shoulder     Meds ordered this encounter  Medications   ibuprofen (ADVIL) 400 MG tablet    Sig: Take 1 tablet (400 mg total) by mouth every 8 (eight) hours as needed.    Dispense:  90 tablet    Refill:  3    68 year old female with rotator cuff insufficiency proximal to migration of the humerus decreased humeral head acromial distance and shoulder pain  Recommend nonoperative treatment at first  We discussed the possibility of a complete tear if the partial tear progresses  We will start with NSAID Injection subacromial space Exercise program to include stretching and strengthening with Thera-Band's Recheck the shoulder in 3 months    Procedure note for injection   Chief Complaint  Patient presents with   Shoulder Pain    Right      Encounter Diagnoses  Name Primary?   Acute pain of right shoulder Yes   Rotator cuff insufficiency of right shoulder    Impingement syndrome of right shoulder         The patient has consented for injection of the subacromial joint right Joint: shoulder  Medication: Depo-Medrol 40 mg and lidocaine 1%  Time out completed: Yes  The site of injection was cleaned with alcohol and ethyl chloride.  The injection was given without any complications appropriate precautions were given.     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 Acute pain of right shoulder   2. Rotator cuff insufficiency of right shoulder   3. Impingement syndrome of right shoulder      Follow-Up Instructions: Return in about 3 months (around 03/09/2024) for FOLLOW UP, RIGHT, SHOULDER-rci,imp sydr..    Objective: Vital Signs: BP 132/72 Comment: 11/22/23  Ht 5' 2 (1.575 m)   Wt 138 lb (62.6 kg)   BMI 25.24 kg/m   Physical Exam Vitals and nursing note reviewed.  Constitutional:      Appearance: Normal appearance.  HENT:     Head: Normocephalic and atraumatic.  Eyes:     General: No scleral icterus.       Right eye: No discharge.        Left eye: No discharge.     Extraocular Movements: Extraocular movements intact.     Conjunctiva/sclera: Conjunctivae normal.     Pupils: Pupils are equal, round, and  reactive to light.  Cardiovascular:     Rate and Rhythm: Normal rate.     Pulses: Normal pulses.  Skin:    General: Skin is warm and dry.     Capillary Refill: Capillary refill takes less than 2 seconds.  Neurological:     General: No focal deficit present.     Mental Status: She is alert and oriented to person, place, and time.  Psychiatric:        Mood and Affect: Mood normal.        Behavior: Behavior normal.        Thought Content: Thought content normal.        Judgment: Judgment normal.      Ortho Exam  Findings on shoulder exam include normal internal/external rotation of the arm with the arm at the side normal passive range of motion with crepitance and painful range of motion the subacromial space but intact rotator cuff with 5 out of 5 manual muscle testing  Positive impingement signs   Specialty  Comments:  No specialty comments available.  Imaging: DG Shoulder Right Result Date: 12/08/2023 Imaging report right shoulder for right shoulder pain We have a total of 4 views of the shoulder Type II acromion Glenohumeral joint normal Humeral head inferior small spur Humeral head acromial distance decreased, humeral head height increased relative to glenoid Greater tuberosity sclerosis Impression probable chronic rotator cuff tear     PMFS History: Patient Active Problem List   Diagnosis Date Noted   Senile osteoporosis 11/10/2023   Past Medical History:  Diagnosis Date   Arthralgia of hand    Arthralgia of shoulder    Cervical disc disorder    Chronic low back pain    Depression    GERD (gastroesophageal reflux disease)    Hyperlipidemia    Hypertension    Neck pain    Nicotine dependence    Osteoarthritis    Prediabetes    Seborrheic dermatitis     Family History  Problem Relation Age of Onset   Heart attack Mother    Heart disease Mother    Diabetes Mother    Heart attack Sister    Heart attack Brother     Past Surgical History:  Procedure Laterality Date   CHOLECYSTECTOMY     TUBAL LIGATION     Social History   Occupational History   Not on file  Tobacco Use   Smoking status: Every Day    Current packs/day: 0.50    Types: Cigarettes   Smokeless tobacco: Never  Substance and Sexual Activity   Alcohol use: Yes    Comment: Rare   Drug use: No   Sexual activity: Yes

## 2023-12-12 ENCOUNTER — Encounter: Payer: Self-pay | Admitting: Radiology

## 2024-01-19 NOTE — Progress Notes (Signed)
 Assessment and Plan:   Assessment & Plan Cellulitis of nasal tip Nasal abscess or furuncle Swelling and tenderness in nasal cavity.  - Prescribed topical antibiotic ointment for nasal application. - Prescribed Keflex 4 times daily for 7 days. - Advised against poking or pinching affected area. - Instructed to seek further care if symptoms worsen after 2-3 days of antibiotics. Orders:   mupirocin (BACTROBAN) 2 % ointment; Apply topically two (2) times a day for 7 days.   cephalexin (KEFLEX) 500 MG capsule; Take 1 capsule (500 mg total) by mouth every six (6) hours for 7 days.     Patient/patient's family verbalizes understanding and is in agreement with plan at this time.  The following portions of the patient's history were reviewed and updated as appropriate: allergies, current medications, past family history, past medical history, past social history, past surgical history and problem list.   Subjective:   Patient ID: Brooke Webb is a 69 y.o. female Chief Complaint  Patient presents with   Evaluation of Swelling in the Nasal Cavity    X 4 days     History of Present Illness Brooke Webb is a 68 year old female who presents with swelling in the nasal cavity for four days.She reports a painful bump inside her nose for four days, with redness and tenderness over the tip of her nose for two to three days. She has tried to squeeze it without any drainage. She notes soreness around the tip of her nose.  He denies fever and chills.  She denies kidney or liver dysfunction.  She is allergic to codeine.     ROS: Negative unless otherwise noted in HPI.  Objective:   Vital Signs:  BP 142/92 Comment: Patient normally takes B/P med in the evening  Pulse 78   Temp 37 C (98.6 F) (Oral)   Resp 20   Ht 160 cm (5' 3)   Wt 66.7 kg (147 lb)   SpO2 99%   BMI 26.04 kg/m   Body mass index is 26.04 kg/m. No LMP recorded. Patient is postmenopausal.  No results  found for this visit on 01/19/24.   Physical Exam Vitals and nursing note reviewed.  Constitutional:      General: She is not in acute distress.    Appearance: Normal appearance. She is normal weight. She is not ill-appearing or toxic-appearing.  HENT:     Head: Normocephalic.     Right Ear: Tympanic membrane, ear canal and external ear normal.     Left Ear: Tympanic membrane, ear canal and external ear normal.     Nose: Nasal tenderness and mucosal edema present. No nasal deformity.     Left Nostril: No foreign body, epistaxis, septal hematoma or occlusion.      Comments: Small pimple in left interior nostril without current drainage.  Mild erythema on exterior nose.     Mouth/Throat:     Mouth: Mucous membranes are moist.     Pharynx: Oropharynx is clear.  Eyes:     Extraocular Movements: Extraocular movements intact.     Conjunctiva/sclera: Conjunctivae normal.     Pupils: Pupils are equal, round, and reactive to light.  Cardiovascular:     Rate and Rhythm: Normal rate and regular rhythm.     Heart sounds: Normal heart sounds.  Pulmonary:     Effort: Pulmonary effort is normal. No respiratory distress.     Breath sounds: Normal breath sounds.  Abdominal:     General: Abdomen is  flat.     Palpations: Abdomen is soft.  Musculoskeletal:        General: Normal range of motion.     Cervical back: Normal range of motion.  Skin:    General: Skin is warm and dry.  Neurological:     General: No focal deficit present.     Mental Status: She is alert and oriented to person, place, and time. Mental status is at baseline.  Psychiatric:        Mood and Affect: Mood normal.        Behavior: Behavior normal.        Thought Content: Thought content normal.        Judgment: Judgment normal.       PHQ-2 Score: 0  PHQ-9 Score:    Screening complete, no depression identified / no further action needed today   Follow-up as Needed  and Follow-up with PCP  The use of abridge  was used to help in the completion of this note. Patient was educated and verbalized consent of its use.   Disposition Upon Discharge:  1.  Routine symptom specific, illness specific and/or disease specific instructions were discussed with the patient and/or caregiver at length.  2.  The differential diagnosis for the patient's specific symptoms were reviewed and discussed with the patient/caregiver at length; the patient/caregiver expressed understanding of all discussed and their relevant questions were all satisfactorily answered.  3.  Return to care should the presenting symptoms recur, persist, or worsen in any way; or in the alternative, if new symptoms or complaints develop.  4.  The patient and any family present were given verbal and/or written discharge instructions as clinically indicated and appropriate.  5.  Continue OTC medications as needed and tolerated for control of the currently reported symptoms, if no conflict with the currently prescribed medications.

## 2024-01-22 ENCOUNTER — Emergency Department (HOSPITAL_COMMUNITY)
Admission: EM | Admit: 2024-01-22 | Discharge: 2024-01-22 | Disposition: A | Discharge status: Home / Self Care | Attending: Emergency Medicine | Admitting: Emergency Medicine

## 2024-01-22 ENCOUNTER — Encounter (HOSPITAL_COMMUNITY): Payer: Self-pay

## 2024-01-22 DIAGNOSIS — J3489 Other specified disorders of nose and nasal sinuses: Secondary | ICD-10-CM | POA: Insufficient documentation

## 2024-01-22 DIAGNOSIS — F17200 Nicotine dependence, unspecified, uncomplicated: Secondary | ICD-10-CM | POA: Insufficient documentation

## 2024-01-22 MED ORDER — DOXYCYCLINE HYCLATE 100 MG PO CAPS: 100.0000 mg | ORAL_CAPSULE | Freq: Two times a day (BID) | ORAL | 0 refills | Status: AC

## 2024-01-22 NOTE — Discharge Instructions (Addendum)
 Stop taking Keflex and start taking doxycycline .  Apply warm compresses to nose or stand in hot showers.  Apply antibiotic ointment as needed.

## 2024-01-22 NOTE — ED Provider Notes (Signed)
 [facility] Provider Note   CSN: [id number] Arrival date & time: 01/22/24  1424     Patient presents with: Recurrent Skin Infections   Brooke Webb is a 68 y.o. female.  Presents today with a erythematous bump in left nostril that started a week.  She states it has been draining slightly and tender to touch. Pt does pluck her nostril hairs.  Patient was seen at an urgent care 3 days ago where she was prescribed cephalexin and mupirocin.  However she noticed that her symptoms are getting worse.  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  Has fevers chills shortness of breath cough congestion or any other symptoms at this time.  Allergic to codeine.  Active smoker.  No drug use.  No liver or kidney abnormalities.       HPI     Prior to Admission medications  Medication Sig Start Date End Date Taking? Authorizing Provider  doxycycline  (VIBRAMYCIN ) 100 MG capsule Take 1 capsule (100 mg total) by mouth 2 (two) times daily for 5 days. 01/22/24 01/27/24 Yes Isidra Mings, PA-C  amLODipine (NORVASC) 5 MG tablet Take 5 mg by mouth daily. 11/21/23   [provider]  atorvastatin (LIPITOR) 10 MG tablet Take 10 mg by mouth daily. 10/28/23   [provider]  DULoxetine (CYMBALTA) 60 MG capsule Take 60 mg by mouth daily.    [provider]  ibuprofen  (ADVIL ) 400 MG tablet Take 1 tablet (400 mg total) by mouth every 8 (eight) hours as needed. 12/08/23   Margrette Taft BRAVO, MD  losartan (COZAAR) 100 MG tablet Take 100 mg by mouth daily.    [provider]  omeprazole (PRILOSEC) 20 MG capsule Take 20 mg by mouth daily.    [provider]    Allergies: Codeine    Review of Systems  Skin:        Bump in left nostril     Updated Vital Signs BP 129/64 (BP Location: Right Arm)   Pulse 77   Temp 98 F (36.7 C) (Oral)   Resp 18   Ht 5' 2 (1.575 m)   Wt 63.5 kg   SpO2 97%   BMI 25.61 kg/m   Physical  Exam Vitals and nursing note reviewed.  Constitutional:      General: She is not in acute distress.    Appearance: Normal appearance. She is well-developed. She is not ill-appearing.  HENT:     Head: Normocephalic and atraumatic.     Nose: No septal deviation or laceration.     Comments: Crusting within the anterior left nasal cavity, TTP and erythema to nasal tip. Erythema and edema of the left nasal vestibule with visible swelling   Eyes:     General: Lids are normal. Vision grossly intact. No visual field deficit or scleral icterus.    Extraocular Movements: Extraocular movements intact.     Conjunctiva/sclera: Conjunctivae normal.  Cardiovascular:     Rate and Rhythm: Normal rate and regular rhythm.     Heart sounds: No murmur heard. Pulmonary:     Effort: Pulmonary effort is normal. No respiratory distress.     Breath sounds: Normal breath sounds.  Abdominal:     Palpations: Abdomen is soft.     Tenderness: There is no abdominal tenderness.  Musculoskeletal:        General: No swelling.     Cervical back: Neck supple.  Lymphadenopathy:     Cervical:  No cervical adenopathy.  Skin:    General: Skin is warm and dry.     Capillary Refill: Capillary refill takes less than 2 seconds.  Neurological:     Mental Status: She is alert.  Psychiatric:        Mood and Affect: Mood normal.     (all labs ordered are listed, but only abnormal results are displayed) Labs Reviewed - No data to display  EKG: None  Radiology: No results found.   Procedures   Medications Ordered in the ED - No data to display                                  Medical Decision Making    Patient presents with swelling and tenderness to left nasal cavity along with crusted bump.  She was started on Keflex however her symptoms have persisted.  On exam she has erythematous and swelling to her nasal cavity and a crusting of left nostril tip.  She is not ill-appearing and afebrile at this time.  We  discussed that this findings are consistent with nasal vestibulitis.  I recommended her to stop Keflex and switch to Doxy.  Also suggested the patient to apply warm compresses to the nostril or stand in hot showers.  Patient is in agreement with plan and is stable for discharge.       Final diagnoses:  Nasal vestibulitis    ED Discharge Orders          Ordered    doxycycline  (VIBRAMYCIN ) 100 MG capsule  2 times daily        01/22/24 1529               Braxton Dubois, PA-C 01/22/24 1538    Cleotilde Rogue, MD 01/22/24 2224

## 2024-01-22 NOTE — ED Triage Notes (Signed)
 Pt c/o increasing swelling to tip of nose x6 days.  Pain score 7/10.  Pt was seen at UC x3 days ago and started on antibiotics w/o resolve.  Hx of MRSA.

## 2024-02-21 ENCOUNTER — Encounter: Payer: Self-pay | Admitting: Internal Medicine

## 2024-03-02 ENCOUNTER — Encounter: Payer: Self-pay | Admitting: Internal Medicine

## 2024-03-09 ENCOUNTER — Ambulatory Visit: Admitting: Orthopedic Surgery

## 2024-05-29 ENCOUNTER — Ambulatory Visit
